# Patient Record
Sex: Male | Born: 1965 | Race: Black or African American | Hispanic: No | Marital: Single | Smoking: Current some day smoker
Health system: Southern US, Community
[De-identification: ages and names within clinical notes are randomized; demographics above are authoritative.]

## PROBLEM LIST (undated history)

## (undated) DIAGNOSIS — N1831 Chronic kidney disease, stage 3a: Secondary | ICD-10-CM

## (undated) DIAGNOSIS — I10 Essential (primary) hypertension: Secondary | ICD-10-CM

## (undated) DIAGNOSIS — E1129 Type 2 diabetes mellitus with other diabetic kidney complication: Secondary | ICD-10-CM

## (undated) DIAGNOSIS — H5440 Blindness, one eye, unspecified eye: Secondary | ICD-10-CM

---

## 2022-04-24 ENCOUNTER — Encounter: Payer: Self-pay | Admitting: Internal Medicine

## 2022-04-24 ENCOUNTER — Observation Stay: Payer: Self-pay

## 2022-04-24 ENCOUNTER — Emergency Department: Payer: Self-pay

## 2022-04-24 ENCOUNTER — Observation Stay
Admission: EM | Admit: 2022-04-24 | Discharge: 2022-04-25 | Disposition: A | Discharge status: Home / Self Care | Payer: Self-pay | Attending: Obstetrics and Gynecology | Admitting: Obstetrics and Gynecology

## 2022-04-24 ENCOUNTER — Other Ambulatory Visit: Payer: Self-pay

## 2022-04-24 ENCOUNTER — Observation Stay
Admit: 2022-04-24 | Discharge: 2022-04-24 | Disposition: A | Discharge status: Home / Self Care | Payer: Self-pay | Attending: Internal Medicine | Admitting: Internal Medicine

## 2022-04-24 DIAGNOSIS — R079 Chest pain, unspecified: Secondary | ICD-10-CM

## 2022-04-24 DIAGNOSIS — F17210 Nicotine dependence, cigarettes, uncomplicated: Secondary | ICD-10-CM | POA: Insufficient documentation

## 2022-04-24 DIAGNOSIS — N1831 Chronic kidney disease, stage 3a: Secondary | ICD-10-CM

## 2022-04-24 DIAGNOSIS — E1129 Type 2 diabetes mellitus with other diabetic kidney complication: Secondary | ICD-10-CM | POA: Diagnosis present

## 2022-04-24 DIAGNOSIS — E1122 Type 2 diabetes mellitus with diabetic chronic kidney disease: Secondary | ICD-10-CM | POA: Insufficient documentation

## 2022-04-24 DIAGNOSIS — R778 Other specified abnormalities of plasma proteins: Secondary | ICD-10-CM | POA: Insufficient documentation

## 2022-04-24 DIAGNOSIS — I2693 Single subsegmental pulmonary embolism without acute cor pulmonale: Principal | ICD-10-CM | POA: Insufficient documentation

## 2022-04-24 DIAGNOSIS — I2699 Other pulmonary embolism without acute cor pulmonale: Secondary | ICD-10-CM

## 2022-04-24 DIAGNOSIS — I10 Essential (primary) hypertension: Secondary | ICD-10-CM | POA: Diagnosis present

## 2022-04-24 DIAGNOSIS — I129 Hypertensive chronic kidney disease with stage 1 through stage 4 chronic kidney disease, or unspecified chronic kidney disease: Secondary | ICD-10-CM | POA: Insufficient documentation

## 2022-04-24 DIAGNOSIS — I160 Hypertensive urgency: Secondary | ICD-10-CM

## 2022-04-24 DIAGNOSIS — R7989 Other specified abnormal findings of blood chemistry: Secondary | ICD-10-CM | POA: Diagnosis present

## 2022-04-24 HISTORY — DX: Chronic kidney disease, stage 3a: N18.31

## 2022-04-24 HISTORY — DX: Type 2 diabetes mellitus with other diabetic kidney complication: E11.29

## 2022-04-24 HISTORY — DX: Essential (primary) hypertension: I10

## 2022-04-24 LAB — CBC WITH DIFFERENTIAL/PLATELET
Abs Immature Granulocytes: 0.01 10*3/uL (ref 0.00–0.07)
Basophils Absolute: 0.1 10*3/uL (ref 0.0–0.1)
Basophils Relative: 1 %
Eosinophils Absolute: 0.2 10*3/uL (ref 0.0–0.5)
Eosinophils Relative: 3 %
HCT: 35.1 % — ABNORMAL LOW (ref 39.0–52.0)
Hemoglobin: 11.4 g/dL — ABNORMAL LOW (ref 13.0–17.0)
Immature Granulocytes: 0 %
Lymphocytes Relative: 33 %
Lymphs Abs: 1.8 10*3/uL (ref 0.7–4.0)
MCH: 28.3 pg (ref 26.0–34.0)
MCHC: 32.5 g/dL (ref 30.0–36.0)
MCV: 87.1 fL (ref 80.0–100.0)
Monocytes Absolute: 0.4 10*3/uL (ref 0.1–1.0)
Monocytes Relative: 8 %
Neutro Abs: 2.9 10*3/uL (ref 1.7–7.7)
Neutrophils Relative %: 55 %
Platelets: 246 10*3/uL (ref 150–400)
RBC: 4.03 MIL/uL — ABNORMAL LOW (ref 4.22–5.81)
RDW: 14.1 % (ref 11.5–15.5)
WBC: 5.3 10*3/uL (ref 4.0–10.5)
nRBC: 0 % (ref 0.0–0.2)

## 2022-04-24 LAB — PROTIME-INR
INR: 1.1 (ref 0.8–1.2)
Prothrombin Time: 14 seconds (ref 11.4–15.2)

## 2022-04-24 LAB — COMPREHENSIVE METABOLIC PANEL
ALT: 12 U/L (ref 0–44)
AST: 17 U/L (ref 15–41)
Albumin: 2.8 g/dL — ABNORMAL LOW (ref 3.5–5.0)
Alkaline Phosphatase: 78 U/L (ref 38–126)
Anion gap: 5 (ref 5–15)
BUN: 21 mg/dL — ABNORMAL HIGH (ref 6–20)
CO2: 22 mmol/L (ref 22–32)
Calcium: 8.5 mg/dL — ABNORMAL LOW (ref 8.9–10.3)
Chloride: 108 mmol/L (ref 98–111)
Creatinine, Ser: 1.59 mg/dL — ABNORMAL HIGH (ref 0.61–1.24)
GFR, Estimated: 52 mL/min — ABNORMAL LOW (ref >60)
Glucose, Bld: 178 mg/dL — ABNORMAL HIGH (ref 70–99)
Potassium: 4.3 mmol/L (ref 3.5–5.1)
Sodium: 135 mmol/L (ref 135–145)
Total Bilirubin: 0.7 mg/dL (ref 0.3–1.2)
Total Protein: 7.2 g/dL (ref 6.5–8.1)

## 2022-04-24 LAB — TROPONIN I (HIGH SENSITIVITY)
Troponin I (High Sensitivity): 19 ng/L — ABNORMAL HIGH (ref <18)
Troponin I (High Sensitivity): 22 ng/L — ABNORMAL HIGH (ref <18)

## 2022-04-24 LAB — LIPASE, BLOOD: Lipase: 26 U/L (ref 11–51)

## 2022-04-24 LAB — APTT: aPTT: 60 seconds — ABNORMAL HIGH (ref 24–36)

## 2022-04-24 LAB — GLUCOSE, CAPILLARY
Glucose-Capillary: 199 mg/dL — ABNORMAL HIGH (ref 70–99)
Glucose-Capillary: 262 mg/dL — ABNORMAL HIGH (ref 70–99)

## 2022-04-24 LAB — BRAIN NATRIURETIC PEPTIDE: B Natriuretic Peptide: 176.5 pg/mL — ABNORMAL HIGH (ref 0.0–100.0)

## 2022-04-24 LAB — CBG MONITORING, ED: Glucose-Capillary: 189 mg/dL — ABNORMAL HIGH (ref 70–99)

## 2022-04-24 LAB — ANTITHROMBIN III: AntiThromb III Func: 108 % (ref 75–120)

## 2022-04-24 LAB — D-DIMER, QUANTITATIVE: D-Dimer, Quant: 1.52 ug/mL-FEU — ABNORMAL HIGH (ref 0.00–0.50)

## 2022-04-24 LAB — HEPARIN LEVEL (UNFRACTIONATED): Heparin Unfractionated: 0.39 IU/mL (ref 0.30–0.70)

## 2022-04-24 MED ORDER — OXYCODONE-ACETAMINOPHEN 5-325 MG PO TABS: 1.0000 | ORAL_TABLET | ORAL | Status: DC | PRN

## 2022-04-24 MED ORDER — MORPHINE SULFATE (PF) 4 MG/ML IV SOLN
4.0000 mg | INTRAVENOUS | Status: DC | PRN
  Administered 2022-04-24: 4 mg via INTRAVENOUS
  Filled 2022-04-24: qty 1

## 2022-04-24 MED ORDER — AMLODIPINE BESYLATE 10 MG PO TABS
10.0000 mg | ORAL_TABLET | Freq: Every day | ORAL | Status: DC
  Administered 2022-04-25: 10 mg via ORAL
  Filled 2022-04-24: qty 1

## 2022-04-24 MED ORDER — ONDANSETRON HCL 4 MG/2ML IJ SOLN: 4.0000 mg | Freq: Three times a day (TID) | INTRAMUSCULAR | Status: DC | PRN

## 2022-04-24 MED ORDER — HYDROMORPHONE HCL 1 MG/ML IJ SOLN: 1.0000 mg | INTRAMUSCULAR | Status: DC | PRN

## 2022-04-24 MED ORDER — INSULIN ASPART 100 UNIT/ML IJ SOLN
0.0000 [IU] | Freq: Every day | INTRAMUSCULAR | Status: DC
  Administered 2022-04-24: 3 [IU] via SUBCUTANEOUS
  Filled 2022-04-24: qty 1

## 2022-04-24 MED ORDER — SODIUM CHLORIDE 0.9 % IV BOLUS
1000.0000 mL | Freq: Once | INTRAVENOUS | Status: AC
  Administered 2022-04-24: 1000 mL via INTRAVENOUS

## 2022-04-24 MED ORDER — IOHEXOL 350 MG/ML SOLN
75.0000 mL | Freq: Once | INTRAVENOUS | Status: AC | PRN
  Administered 2022-04-24: 75 mL via INTRAVENOUS

## 2022-04-24 MED ORDER — HYDRALAZINE HCL 20 MG/ML IJ SOLN
5.0000 mg | INTRAMUSCULAR | Status: DC | PRN
  Administered 2022-04-24: 5 mg via INTRAVENOUS
  Filled 2022-04-24: qty 1

## 2022-04-24 MED ORDER — AMLODIPINE BESYLATE 5 MG PO TABS
10.0000 mg | ORAL_TABLET | Freq: Once | ORAL | Status: AC
  Administered 2022-04-24: 10 mg via ORAL
  Filled 2022-04-24: qty 2

## 2022-04-24 MED ORDER — NICOTINE 21 MG/24HR TD PT24: 21.0000 mg | MEDICATED_PATCH | Freq: Every day | TRANSDERMAL | Status: DC

## 2022-04-24 MED ORDER — HEPARIN (PORCINE) 25000 UT/250ML-% IV SOLN
1450.0000 [IU]/h | INTRAVENOUS | Status: DC
Start: 2022-04-24 — End: 2022-04-25
  Administered 2022-04-24: 1300 [IU]/h via INTRAVENOUS
  Administered 2022-04-25: 1450 [IU]/h via INTRAVENOUS
  Filled 2022-04-24 (×2): qty 250

## 2022-04-24 MED ORDER — DM-GUAIFENESIN ER 30-600 MG PO TB12: 1.0000 | ORAL_TABLET | Freq: Two times a day (BID) | ORAL | Status: DC | PRN

## 2022-04-24 MED ORDER — ACETAMINOPHEN 325 MG PO TABS: 650.0000 mg | ORAL_TABLET | Freq: Four times a day (QID) | ORAL | Status: DC | PRN

## 2022-04-24 MED ORDER — HEPARIN BOLUS VIA INFUSION
5000.0000 [IU] | Freq: Once | INTRAVENOUS | Status: AC
  Administered 2022-04-24: 5000 [IU] via INTRAVENOUS
  Filled 2022-04-24: qty 5000

## 2022-04-24 MED ORDER — ASPIRIN 81 MG PO CHEW
81.0000 mg | CHEWABLE_TABLET | Freq: Every day | ORAL | Status: DC
  Administered 2022-04-24 – 2022-04-25 (×2): 81 mg via ORAL
  Filled 2022-04-24 (×2): qty 1

## 2022-04-24 MED ORDER — ALBUTEROL SULFATE (2.5 MG/3ML) 0.083% IN NEBU: 3.0000 mL | INHALATION_SOLUTION | RESPIRATORY_TRACT | Status: DC | PRN

## 2022-04-24 MED ORDER — ONDANSETRON HCL 4 MG/2ML IJ SOLN
4.0000 mg | Freq: Once | INTRAMUSCULAR | Status: AC
Start: 2022-04-24 — End: 2022-04-24
  Administered 2022-04-24: 4 mg via INTRAVENOUS
  Filled 2022-04-24: qty 2

## 2022-04-24 MED ORDER — INSULIN ASPART 100 UNIT/ML IJ SOLN
0.0000 [IU] | Freq: Three times a day (TID) | INTRAMUSCULAR | Status: DC
  Administered 2022-04-24 (×2): 2 [IU] via SUBCUTANEOUS
  Administered 2022-04-25: 7 [IU] via SUBCUTANEOUS
  Filled 2022-04-24 (×3): qty 1

## 2022-04-24 NOTE — Assessment & Plan Note (Signed)
Recent A1c 9.6, poorly controlled.  Patient taking glipizide and metformin. -Sliding scale insulin

## 2022-04-24 NOTE — ED Provider Notes (Addendum)
[facility] Provider Note    Event Date/Time   First MD Initiated Contact with Patient 04/24/22 [phone]     (approximate)   History   Chest Pain   HPI  Wyatt Taylor is a 56 y.o. male with no known history of cardiac or lung disease presents to the ER for evaluation of several days of left-sided chest pain and pressure not particularly worsened with movement or improved with rest.  No diaphoresis.  Pain is stabbing in nature.  Has never had pain like this before.  States he been off his blood pressure medications for few days but does have access to them.  Denies any abdominal pain no nausea or vomiting.     Physical Exam   Triage Vital Signs: ED Triage Vitals  Enc Vitals Group     BP 04/24/22 0900 (!) 166/98     Pulse Rate 04/24/22 0900 82     Resp 04/24/22 0900 14     Temp 04/24/22 0900 97.9 F (36.6 C)     Temp Source 04/24/22 0900 Oral     SpO2 04/24/22 0858 100 %     Weight 04/24/22 0900 170 lb (77.1 kg)     Height 04/24/22 0900 5\' 9"  (1.753 m)     Head Circumference --      Peak Flow --      Pain Score 04/24/22 0900 2     Pain Loc --      Pain Edu? --      Excl. in [place]? --     Most recent vital signs: Vitals:   04/24/22 0900 04/24/22 0933  BP: (!) 166/98 (!) 172/92  Pulse: 82 75  Resp: 14 16  Temp: 97.9 F (36.6 C)   SpO2: 98% 99%     Constitutional: Alert  Eyes: Conjunctivae are normal.  Head: Atraumatic. Nose: No congestion/rhinnorhea. Mouth/Throat: Mucous membranes are moist.   Neck: Painless ROM.  Cardiovascular:   Good peripheral circulation. No m/g/r Respiratory: Normal respiratory effort.  No retractions.  Gastrointestinal: Soft and nontender.  Musculoskeletal:  no deformity Neurologic:  MAE spontaneously. No gross focal neurologic deficits are appreciated.  Skin:  Skin is warm, dry and intact. No rash noted. Psychiatric: Mood and affect are normal. Speech and behavior are normal.    ED Results / Procedures /  Treatments   Labs (all labs ordered are listed, but only abnormal results are displayed) Labs Reviewed  CBC WITH DIFFERENTIAL/PLATELET - Abnormal; Notable for the following components:      Result Value   RBC 4.03 (*)    Hemoglobin 11.4 (*)    HCT 35.1 (*)    All other components within normal limits  COMPREHENSIVE METABOLIC PANEL - Abnormal; Notable for the following components:   Glucose, Bld 178 (*)    BUN 21 (*)    Creatinine, Ser 1.59 (*)    Calcium 8.5 (*)    Albumin 2.8 (*)    GFR, Estimated 52 (*)    All other components within normal limits  D-DIMER, QUANTITATIVE - Abnormal; Notable for the following components:   D-Dimer, Quant 1.52 (*)    All other components within normal limits  TROPONIN I (HIGH SENSITIVITY) - Abnormal; Notable for the following components:   Troponin I (High Sensitivity) 19 (*)    All other components within normal limits  LIPASE, BLOOD  PROTIME-INR  APTT  HEPARIN LEVEL (UNFRACTIONATED)  BRAIN NATRIURETIC PEPTIDE     EKG  ED ECG REPORT I,  Merlyn Lot, the attending physician, personally viewed and interpreted this ECG.   Date: 04/24/2022  EKG Time: 8:59  Rate: 80  Rhythm: sinus  Axis: normal  Intervals: normal  ST&T Change: no stemi, nonspecific st abn    RADIOLOGY Please see ED Course for my review and interpretation.  I personally reviewed all radiographic images ordered to evaluate for the above acute complaints and reviewed radiology reports and findings.  These findings were personally discussed with the patient.  Please see medical record for radiology report.    PROCEDURES:  Critical Care performed: Yes, see critical care procedure note(s)  .Critical Care Performed by: Merlyn Lot, MD Authorized by: Merlyn Lot, MD   Critical care provider statement:    Critical care time (minutes):  35   Critical care was necessary to treat or prevent imminent or life-threatening deterioration of the following  conditions:  Circulatory failure   Critical care was time spent personally by me on the following activities:  Ordering and performing treatments and interventions, ordering and review of laboratory studies, ordering and review of radiographic studies, pulse oximetry, re-evaluation of patient's condition, review of old charts, obtaining history from patient or surrogate, examination of patient, evaluation of patient's response to treatment, discussions with primary provider, discussions with consultants and development of treatment plan with patient or surrogate   MEDICATIONS ORDERED IN ED: Medications  morphine (PF) 4 MG/ML injection 4 mg (4 mg Intravenous Given 04/24/22 0933)  heparin bolus via infusion 5,000 Units (has no administration in time range)    Followed by  heparin ADULT infusion 100 units/mL (25000 units/262mL) (has no administration in time range)  hydrALAZINE (APRESOLINE) injection 5 mg (has no administration in time range)  ondansetron (ZOFRAN) injection 4 mg (4 mg Intravenous Given 04/24/22 0933)  amLODipine (NORVASC) tablet 10 mg (10 mg Oral Given 04/24/22 0933)  sodium chloride 0.9 % bolus 1,000 mL (0 mLs Intravenous Stopped 04/24/22 1035)  iohexol (OMNIPAQUE) 350 MG/ML injection 75 mL (75 mLs Intravenous Contrast Given 04/24/22 1003)     IMPRESSION / MDM / [place] / ED COURSE  I reviewed the triage vital signs and the nursing notes.                              Differential diagnosis includes, but is not limited to, ACS, pericarditis, esophagitis, boerhaaves, pe, dissection, pna, bronchitis, costochondritis  Patient presented to the ER for evaluation of chest discomfort as described above.  He is hypertensive afebrile otherwise hemodynamically stable and well perfused.  This presenting complaint could reflect a potentially life-threatening illness therefore the patient will be placed on continuous pulse oximetry and telemetry for monitoring.  Laboratory evaluation will  be sent to evaluate for the above complaints.    Clinical Course as of 04/24/22 1037  Sun Apr 24, 2022  0928 Patient's D-dimer is elevated.  Her renal insufficiency will give IV fluids and order CTA to further evaluate for PE. [PR]  1014 Chest x-ray by my interpretation does not show any evidence of infiltrate or mass.  CTA on my interpretation is concerning for possible subsegmental PE in the left lung.  Will await formal radiology report. [PR]  1030 Patient with positive PE.  Given his chest pain discomfort AKI will order heparin.  Will consult hospitalist for admission. [PR]    Clinical Course User Index [PR] Merlyn Lot, MD    Patient's presentation is most consistent with acute presentation with  potential threat to life or bodily function.   FINAL CLINICAL IMPRESSION(S) / ED DIAGNOSES   Final diagnoses:  Chest pain, unspecified type  Single subsegmental pulmonary embolism without acute cor pulmonale ([place])     Rx / DC Orders   ED Discharge Orders     None        Note:  This document was prepared using Dragon voice recognition software and may include unintentional dictation errors.     Merlyn Lot, MD 04/24/22 1056

## 2022-04-24 NOTE — Consult Note (Signed)
ANTICOAGULATION CONSULT NOTE  Pharmacy Consult for IV Heparin Indication: pulmonary embolus  Patient Measurements: Height: 5\' 9"  (175.3 cm) Weight: 77.1 kg (170 lb) IBW/kg (Calculated) : 70.7 Heparin Dosing Weight: 77.1 kg  Labs: Recent Labs    04/24/22 0903  HGB 11.4*  HCT 35.1*  PLT 246  CREATININE 1.59*  TROPONINIHS 19*    Estimated Creatinine Clearance: 53.7 mL/min (A) (by C-G formula based on SCr of 1.59 mg/dL (H)).   Medical History: No past medical history on file.  Medications:  No anticoagulation prior to admission per my chart review. Medication reconciliation is pending  Assessment: Patient is a 56 y/o M with medical history including DM who presented to the ED 6/4 with chest pain and subsequently found to have acute PE. Pharmacy consulted to initiate and manage IV heparin for PE treatment.   Baseline CBC notable for mild anemia. Baseline aPTT and PT-INR are pending.  Goal of Therapy:  Heparin level 0.3-0.7 units/ml Monitor platelets by anticoagulation protocol: Yes   Plan:  --Heparin 5000 unit IV bolus followed by continuous infusion at 1300 units/hr --HL 6 hours after initiation of infusion --Daily CBC per protocol while on IV heparin  8/4 04/24/2022,10:49 AM

## 2022-04-24 NOTE — Assessment & Plan Note (Addendum)
CTA showed acute pulmonary embolism involving segmental and subsegmental branch pulmonary arteries of the left upper lobe and left lower lobe. No CT evidence of right heart strain injury  -Will place on telemetry bed for obs -heparin drip initiated -2D echocardiogram ordered -LE dopplers ordered to evaluate for DVT --> negative for DVT -Hypercoag panel -pain control: When necessary Percocet and Dilaudid -prn albuterol nebs and mucinex  -check BNP

## 2022-04-24 NOTE — H&P (Signed)
History and Physical    Wyatt Taylor [id number] DOB: [birth year] DOA: 04/24/2022  Referring MD/NP/PA:   PCP: Pcp, No   Patient coming from:  The patient is coming from home.  At baseline, pt is independent for most of ADL.        Chief Complaint: Chest pain  HPI: Wyatt Taylor is a 56 y.o. male with medical history significant of hypertension, diabetes mellitus, CKD-3A, smoking cigar, who presents with chest pain.  Patient states that he has been having intermittent chest pain for more than 2 months, which has worsened in the past 2 days.  The chest pain is located in the left side of chest, sharp, moderate, pleuritic, aggravated by deep breath.  Patient has mild dry cough, denies shortness breath.  No fever or chills.  No recent long distance traveling.  No tenderness in the calf areas. Patient does not have nausea vomiting, diarrhea or abdominal pain.  No symptoms of UTI.  No recent dark stool or rectal bleeding.  No recent fall or head injury.   Data Reviewed and ED Course: pt was found to have WBC 5.3, troponin level 19, renal function at baseline, temperature normal, elevated blood pressure 236/137, heart rate 82, RR 18, oxygen saturation 98% on room air.  Chest x-ray negative.  CTA showed segmental and subsegmental PE.  Patient is placed on telemetry bed for obs.  CT angiogram: 1. Acute pulmonary embolism involving segmental and subsegmental branch pulmonary arteries of the left upper lobe and left lower lobe. Additional segmental branch PE within the left lower lobe. No saddle embolism. RV to LV ratio of 0.8. 2. Mild bibasilar atelectasis, slightly more pronounced on the left. Lungs are otherwise clear. 3. Left ventricular hypertrophy. Consider further evaluation with echocardiography. 4. Aortic and coronary artery atherosclerosis (ICD10-I70.0).   These results were called by telephone at the time of interpretation on 04/24/2022 at 10:28 am to provider Merlyn Lot , who  verbally acknowledged these results.  EKG: I have personally reviewed.  Sinus rhythm, QTc 449, T wave inversion in lead I/aVL.   Review of Systems:   General: no fevers, chills, no body weight gain, has fatigue HEENT: no blurry vision, hearing changes or sore throat Respiratory: no dyspnea, has coughing, no wheezing CV: has chest pain, no palpitations GI: no nausea, vomiting, abdominal pain, diarrhea, constipation GU: no dysuria, burning on urination, increased urinary frequency, hematuria  Ext: no leg edema Neuro: no unilateral weakness, numbness, or tingling, no vision change or hearing loss Skin: no rash, no skin tear. MSK: No muscle spasm, no deformity, no limitation of range of movement in spin Heme: No easy bruising.  Travel history: No recent long distant travel.   Allergy: No Known Allergies  Past Medical History:  Diagnosis Date   Chronic renal failure, stage 3a (HCC)    HTN (hypertension)    Type II diabetes mellitus with renal manifestations ([place])     History reviewed. No pertinent surgical history.  Social History:  reports that he has been smoking cigarettes. He has never used smokeless tobacco. He reports that he does not currently use alcohol. He reports that he does not use drugs.  Family History:  Family History  Problem Relation Age of Onset   Deep vein thrombosis Daughter    Pulmonary embolism Daughter      Prior to Admission medications   Not on File    Physical Exam: Vitals:   04/24/22 0900 04/24/22 0933 04/24/22 1142 04/24/22 1300  BP: (!) 166/98 Marland Kitchen)  172/92 (!) 195/88 138/79  Pulse: 82 75  92  Resp: 14 16  20   Temp: 97.9 F (36.6 C)   98.9 F (37.2 C)  TempSrc: Oral   Oral  SpO2: 98% 99%  97%  Weight: 77.1 kg     Height: 5\' 9"  (1.753 m)      General: Not in acute distress HEENT:       Eyes: PERRL, EOMI, no scleral icterus.       ENT: No discharge from the ears and nose, no pharynx injection, no tonsillar enlargement.        Neck:  No JVD, no bruit, no mass felt. Heme: No neck lymph node enlargement. Cardiac: S1/S2, RRR, No murmurs, No gallops or rubs. Respiratory: No rales, wheezing, rhonchi or rubs. GI: Soft, nondistended, nontender, no rebound pain, no organomegaly, BS present. GU: No hematuria Ext: No pitting leg edema bilaterally. 1+DP/PT pulse bilaterally. Musculoskeletal: No joint deformities, No joint redness or warmth, no limitation of ROM in spin. Skin: No rashes.  Neuro: Alert, oriented X3, cranial nerves II-XII grossly intact, moves all extremities normally.  Psych: Patient is not psychotic, no suicidal or hemocidal ideation.  Labs on Admission: I have personally reviewed following labs and imaging studies  CBC: Recent Labs  Lab 04/24/22 0903  WBC 5.3  NEUTROABS 2.9  HGB 11.4*  HCT 35.1*  MCV 87.1  PLT 0000000   Basic Metabolic Panel: Recent Labs  Lab 04/24/22 0903  NA 135  K 4.3  CL 108  CO2 22  GLUCOSE 178*  BUN 21*  CREATININE 1.59*  CALCIUM 8.5*   GFR: Estimated Creatinine Clearance: 53.7 mL/min (A) (by C-G formula based on SCr of 1.59 mg/dL (H)). Liver Function Tests: Recent Labs  Lab 04/24/22 0903  AST 17  ALT 12  ALKPHOS 78  BILITOT 0.7  PROT 7.2  ALBUMIN 2.8*   Recent Labs  Lab 04/24/22 0903  LIPASE 26   No results for input(s): AMMONIA in the last 168 hours. Coagulation Profile: No results for input(s): INR, PROTIME in the last 168 hours. Cardiac Enzymes: No results for input(s): CKTOTAL, CKMB, CKMBINDEX, TROPONINI in the last 168 hours. BNP (last 3 results) No results for input(s): PROBNP in the last 8760 hours. HbA1C: No results for input(s): HGBA1C in the last 72 hours. CBG: Recent Labs  Lab 04/24/22 1254  GLUCAP 189*   Lipid Profile: No results for input(s): CHOL, HDL, LDLCALC, TRIG, CHOLHDL, LDLDIRECT in the last 72 hours. Thyroid Function Tests: No results for input(s): TSH, T4TOTAL, FREET4, T3FREE, THYROIDAB in the last 72 hours. Anemia  Panel: No results for input(s): VITAMINB12, FOLATE, FERRITIN, TIBC, IRON, RETICCTPCT in the last 72 hours. Urine analysis: No results found for: COLORURINE, APPEARANCEUR, LABSPEC, PHURINE, GLUCOSEU, HGBUR, BILIRUBINUR, KETONESUR, PROTEINUR, UROBILINOGEN, NITRITE, LEUKOCYTESUR Sepsis Labs: @LABRCNTIP (procalcitonin:4,lacticidven:4) )No results found for this or any previous visit (from the past 240 hour(s)).   Radiological Exams on Admission: DG Chest 2 View  Result Date: 04/24/2022 CLINICAL DATA:  Sided chest pain.  Evaluate for infiltrate. EXAM: CHEST - 2 VIEW COMPARISON:  None Available. FINDINGS: The heart size and mediastinal contours are within normal limits. Both lungs are clear. The visualized skeletal structures are unremarkable. IMPRESSION: No active cardiopulmonary disease. Electronically Signed   By: Dorise Bullion III M.D.   On: 04/24/2022 09:27   CT Angio Chest PE W and/or Wo Contrast  Result Date: 04/24/2022 CLINICAL DATA:  Pulmonary embolism (PE) suspected, positive D-dimer EXAM: CT ANGIOGRAPHY CHEST WITH CONTRAST TECHNIQUE: Multidetector  CT imaging of the chest was performed using the standard protocol during bolus administration of intravenous contrast. Multiplanar CT image reconstructions and MIPs were obtained to evaluate the vascular anatomy. RADIATION DOSE REDUCTION: This exam was performed according to the departmental dose-optimization program which includes automated exposure control, adjustment of the mA and/or kV according to patient size and/or use of iterative reconstruction technique. CONTRAST:  46mL OMNIPAQUE IOHEXOL 350 MG/ML SOLN COMPARISON:  Same day chest x-ray FINDINGS: Cardiovascular: Adequate opacification of the pulmonary arteries. Filling defect seen within a anterior segmental branch pulmonary artery of the left upper lobe (series 6, image 156) extending into distal subsegmental branch arteries. Additional segmental branch left lower lobe PE (series 6, image 208).  No right-sided pulmonary arterial filling defect. No saddle embolism. RV to LV ratio of 0.8. Nonenlarged heart with prominent left ventricular hypertrophy. Thoracic aorta is nonaneurysmal. Scattered atherosclerotic calcifications of the aorta and coronary arteries. Mediastinum/Nodes: No enlarged mediastinal, hilar, or axillary lymph nodes. Thyroid gland, trachea, and esophagus demonstrate no significant findings. Lungs/Pleura: Mild bibasilar atelectasis, slightly more pronounced on the left. Lungs are otherwise clear. No pleural effusion or pneumothorax. Upper Abdomen: No acute abnormality. Musculoskeletal: No chest wall abnormality. No acute or significant osseous findings. Multiple old healed right-sided rib fractures. Review of the MIP images confirms the above findings. IMPRESSION: 1. Acute pulmonary embolism involving segmental and subsegmental branch pulmonary arteries of the left upper lobe and left lower lobe. Additional segmental branch PE within the left lower lobe. No saddle embolism. RV to LV ratio of 0.8. 2. Mild bibasilar atelectasis, slightly more pronounced on the left. Lungs are otherwise clear. 3. Left ventricular hypertrophy. Consider further evaluation with echocardiography. 4. Aortic and coronary artery atherosclerosis (ICD10-I70.0). These results were called by telephone at the time of interpretation on 04/24/2022 at 10:28 am to provider Merlyn Lot , who verbally acknowledged these results. Electronically Signed   By: Davina Poke D.O.   On: 04/24/2022 10:30   US Venous Img Lower Bilateral (DVT)  Result Date: 04/24/2022 CLINICAL DATA:  56 year old male with acute pulmonary emboli. Evaluate LOWER extremities for DVT. EXAM: BILATERAL LOWER EXTREMITY VENOUS DOPPLER ULTRASOUND TECHNIQUE: Gray-scale sonography with compression, as well as color and duplex ultrasound, were performed to evaluate the deep venous system(s) from the level of the common femoral vein through the popliteal and  proximal calf veins. COMPARISON:  None Available. FINDINGS: VENOUS Normal compressibility of the common femoral, superficial femoral, and popliteal veins, as well as the visualized calf veins. Visualized portions of profunda femoral vein and great saphenous vein unremarkable. No filling defects to suggest DVT on grayscale or color Doppler imaging. Doppler waveforms show normal direction of venous flow, normal respiratory plasticity and response to augmentation. OTHER None. Limitations: none IMPRESSION: Negative.  No evidence of DVT within bilateral LOWER extremities. Electronically Signed   By: Margarette Canada M.D.   On: 04/24/2022 12:22      Assessment/Plan Principal Problem:   Acute pulmonary embolism (HCC) Active Problems:   Hypertensive urgency   Chronic renal failure, stage 3a (HCC)   Type II diabetes mellitus with renal manifestations (HCC)   Elevated troponin     Assessment and Plan: * Acute pulmonary embolism (HCC) CTA showed acute pulmonary embolism involving segmental and subsegmental branch pulmonary arteries of the left upper lobe and left lower lobe. No CT evidence of right heart strain injury  -Will place on telemetry bed for obs -heparin drip initiated -2D echocardiogram ordered -LE dopplers ordered to evaluate for DVT -->  negative for DVT -Hypercoag panel -pain control: When necessary Percocet and Dilaudid -prn albuterol nebs and mucinex  -check BNP  Hypertensive urgency Bp 236/137.  This is most likely due to medication noncompliance.  Patient is not taking his amlodipine and Cozaar at home. -Start amlodipine 10 mg daily -IV hydralazine as needed  Chronic renal failure, stage 3a (HCC) Recent creatinine 2.61 on 01/01/2022.  His creatinine is 1.59, BUN 21.  -Follow-up with BMP  Type II diabetes mellitus with renal manifestations (HCC) Recent A1c 9.6, poorly controlled.  Patient taking glipizide and metformin. -Sliding scale insulin  Elevated troponin Troponin  level 19.  Most likely due to PE and a demand ischemia secondary to hypertensive urgency. -get another trop -ASA             DVT ppx:  on IV Heparin    Code Status: Full code  Family Communication:    Yes, patient's daughter   at bed side.      Disposition Plan:  Anticipate discharge back to previous environment  Consults called:  none  Admission status and  Level of care: Telemetry Cardiac:  for obs    Severity of Illness:  The appropriate patient status for this patient is OBSERVATION. Observation status is judged to be reasonable and necessary in order to provide the required intensity of service to ensure the patient's safety. The patient's presenting symptoms, physical exam findings, and initial radiographic and laboratory data in the context of their medical condition is felt to place them at decreased risk for further clinical deterioration. Furthermore, it is anticipated that the patient will be medically stable for discharge from the hospital within 2 midnights of admission.        Date of Service 04/24/2022    [facility]   If 7PM-7AM, please contact night-coverage [email or web address] 04/24/2022, 1:59 PM

## 2022-04-24 NOTE — ED Triage Notes (Signed)
Pt bib EMS c/o intermittent left sided chest pain, non radiating x 2 days progressively getting worse. Pt received ASA 4, Nitro x 1, Nitro paste 1.5inch; pain now 2/10 per pt. Denies other symptoms. H/O HTN  BP 236/137-> 201/110 (did not take BP pills). HR 84 SPO2 100% RA

## 2022-04-24 NOTE — Assessment & Plan Note (Signed)
Recent creatinine 2.61 on 01/01/2022.  His creatinine is 1.59, BUN 21.  -Follow-up with BMP

## 2022-04-24 NOTE — Consult Note (Addendum)
[place] for IV Heparin Indication: pulmonary embolus  Patient Measurements: Height: 5\' 9"  (175.3 cm) Weight: 77.1 kg (170 lb) IBW/kg (Calculated) : 70.7 Heparin Dosing Weight: 77.1 kg  Labs: Recent Labs    04/24/22 0903 04/24/22 1611 04/24/22 1800  HGB 11.4*  --   --   HCT 35.1*  --   --   PLT 246  --   --   APTT  --  60*  --   LABPROT  --  14.0  --   INR  --  1.1  --   HEPARINUNFRC  --   --  0.39  CREATININE 1.59*  --   --   TROPONINIHS 19* 22*  --      Estimated Creatinine Clearance: 53.7 mL/min (A) (by C-G formula based on SCr of 1.59 mg/dL (H)).   Medical History: Past Medical History:  Diagnosis Date   Chronic renal failure, stage 3a (HCC)    HTN (hypertension)    Type II diabetes mellitus with renal manifestations (HCC)     Medications:  No anticoagulation prior to admission per my chart review. Medication reconciliation is pending  Assessment: Patient is a 56 y/o M with medical history including DM who presented to the ED 6/4 with chest pain and subsequently found to have acute PE. Pharmacy consulted to initiate and manage IV heparin for PE treatment.   Baseline CBC notable for mild anemia. Baseline aPTT and PT-INR are pending.  Date Time HL Rate/comment 6/4 1800 0.39 1300 un/hr, therapeutic   Goal of Therapy:  Heparin level 0.3-0.7 units/ml Monitor platelets by anticoagulation protocol: Yes   Plan:  Heparin level 0.39, therapeutic  Continue heparin infusion at 1300 units/hr Check confirmatory heparin level in 6 hours  Monitor daily CBC, s/s of bleed   Darnelle Bos, PharmD 04/24/2022,8:15 PM

## 2022-04-24 NOTE — Assessment & Plan Note (Signed)
Bp 236/137.  This is most likely due to medication noncompliance.  Patient is not taking his amlodipine and Cozaar at home. -Start amlodipine 10 mg daily -IV hydralazine as needed

## 2022-04-24 NOTE — Assessment & Plan Note (Signed)
Troponin level 19.  Most likely due to PE and a demand ischemia secondary to hypertensive urgency. -get another trop -ASA

## 2022-04-25 ENCOUNTER — Other Ambulatory Visit: Payer: Self-pay

## 2022-04-25 LAB — ECHOCARDIOGRAM COMPLETE
AR max vel: 2.33 cm2
AV Area VTI: 2.72 cm2
AV Area mean vel: 2.25 cm2
AV Mean grad: 7 mmHg
AV Peak grad: 10.6 mmHg
Ao pk vel: 1.63 m/s
Area-P 1/2: 4.77 cm2
Height: 69 in
MV VTI: 2.75 cm2
S' Lateral: 2.24 cm
Weight: 2720 oz

## 2022-04-25 LAB — HIV ANTIBODY (ROUTINE TESTING W REFLEX): HIV Screen 4th Generation wRfx: NONREACTIVE

## 2022-04-25 LAB — CBC
HCT: 27.6 % — ABNORMAL LOW (ref 39.0–52.0)
Hemoglobin: 9.4 g/dL — ABNORMAL LOW (ref 13.0–17.0)
MCH: 28.9 pg (ref 26.0–34.0)
MCHC: 34.1 g/dL (ref 30.0–36.0)
MCV: 84.9 fL (ref 80.0–100.0)
Platelets: 227 10*3/uL (ref 150–400)
RBC: 3.25 MIL/uL — ABNORMAL LOW (ref 4.22–5.81)
RDW: 13.9 % (ref 11.5–15.5)
WBC: 5.6 10*3/uL (ref 4.0–10.5)
nRBC: 0 % (ref 0.0–0.2)

## 2022-04-25 LAB — BASIC METABOLIC PANEL
Anion gap: 5 (ref 5–15)
BUN: 27 mg/dL — ABNORMAL HIGH (ref 6–20)
CO2: 23 mmol/L (ref 22–32)
Calcium: 7.8 mg/dL — ABNORMAL LOW (ref 8.9–10.3)
Chloride: 109 mmol/L (ref 98–111)
Creatinine, Ser: 1.64 mg/dL — ABNORMAL HIGH (ref 0.61–1.24)
GFR, Estimated: 50 mL/min — ABNORMAL LOW (ref >60)
Glucose, Bld: 253 mg/dL — ABNORMAL HIGH (ref 70–99)
Potassium: 4.3 mmol/L (ref 3.5–5.1)
Sodium: 137 mmol/L (ref 135–145)

## 2022-04-25 LAB — GLUCOSE, CAPILLARY
Glucose-Capillary: 303 mg/dL — ABNORMAL HIGH (ref 70–99)
Glucose-Capillary: 145 mg/dL — ABNORMAL HIGH (ref 70–99)

## 2022-04-25 LAB — HEPARIN LEVEL (UNFRACTIONATED)
Heparin Unfractionated: 0.26 IU/mL — ABNORMAL LOW (ref 0.30–0.70)
Heparin Unfractionated: 0.38 IU/mL (ref 0.30–0.70)

## 2022-04-25 MED ORDER — APIXABAN (ELIQUIS) VTE STARTER PACK (10MG AND 5MG)
ORAL_TABLET | ORAL | 0 refills | Status: DC
Start: 2022-04-25 — End: 2022-04-25

## 2022-04-25 MED ORDER — HEPARIN BOLUS VIA INFUSION
1200.0000 [IU] | Freq: Once | INTRAVENOUS | Status: AC
  Administered 2022-04-25: 1200 [IU] via INTRAVENOUS
  Filled 2022-04-25: qty 1200

## 2022-04-25 MED ORDER — APIXABAN 5 MG PO TABS
ORAL_TABLET | ORAL | Status: AC
  Filled 2022-04-25: qty 2

## 2022-04-25 MED ORDER — APIXABAN 5 MG PO TABS
10.0000 mg | ORAL_TABLET | Freq: Once | ORAL | Status: AC
Start: 2022-04-25 — End: 2022-04-25
  Administered 2022-04-25: 10 mg via ORAL

## 2022-04-25 MED ORDER — APIXABAN 5 MG PO TABS
5.0000 mg | ORAL_TABLET | Freq: Two times a day (BID) | ORAL | 0 refills | Status: DC
  Filled 2022-04-25: qty 60, 30d supply, fill #0

## 2022-04-25 MED ORDER — APIXABAN 5 MG PO TABS
5.0000 mg | ORAL_TABLET | Freq: Two times a day (BID) | ORAL | 0 refills | Status: AC
  Filled 2022-04-25: qty 60, 30d supply, fill #0

## 2022-04-25 MED ORDER — APIXABAN 5 MG PO TABS: 5.0000 mg | ORAL_TABLET | Freq: Two times a day (BID) | ORAL | 0 refills | Status: DC

## 2022-04-25 MED ORDER — APIXABAN (ELIQUIS) VTE STARTER PACK (10MG AND 5MG)
ORAL_TABLET | ORAL | 0 refills | Status: DC
  Filled 2022-04-25: qty 74, 28d supply, fill #0

## 2022-04-25 NOTE — Discharge Summary (Signed)
Wyatt Taylor [id number] DOB: [birth year] DOA: 04/24/2022  PCP: Pcp, No  Admit date: 04/24/2022 Discharge date: 04/25/2022  Time spent: 40 minutes  Recommendations for Outpatient Follow-up:  PCP f/u 1-2 weeks, will need to f/u results of hypercoagulability panel ordered and pending Will need to ensure uptodate with cancer screening Consider hematology f/u     Discharge Diagnoses:  Principal Problem:   Acute pulmonary embolism ([place]) Active Problems:   Hypertensive urgency   HTN (hypertension)   Chronic renal failure, stage 3a ([place])   Type II diabetes mellitus with renal manifestations ([place])   Elevated troponin   Discharge Condition: stable  Diet recommendation: heart healthy  Filed Weights   04/24/22 0900  Weight: 77.1 kg    History of present illness:  From admission h and p Wyatt Taylor is a 56 y.o. male with medical history significant of hypertension, diabetes mellitus, CKD-3A, smoking cigar, who presents with chest pain.   Patient states that he has been having intermittent chest pain for more than 2 months, which has worsened in the past 2 days.  The chest pain is located in the left side of chest, sharp, moderate, pleuritic, aggravated by deep breath.  Patient has mild dry cough, denies shortness breath.  No fever or chills.  No recent long distance traveling.  No tenderness in the calf areas. Patient does not have nausea vomiting, diarrhea or abdominal pain.  No symptoms of UTI.  No recent dark stool or rectal bleeding.  No recent fall or head injury.  Hospital Course:  Patient presented with 2 months pleuritic chest pain. CTA showed acute PE involving segmental and subsegmental branch pulmonary artieries left upper and lower lobes. No right heart strain on CTA and confirmed with TTE. PVLs negative for DVT. No hypxoxia or dyspnea, ambulating without difficulty. Hypercoag panel ordered and pending. Hgb 9s is stable from prior readings, no report of bleeding. Treated 24  hours with IV heparin, stable on that. Discharged home with apixaban, given card for first month free. Also initial hypertensive urgency resolved with resumption of home meds. A1c in the 9s, TTE does show severe LVH, I discussed with patient importance of treating his chronic medical conditions.  Procedures: none   Consultations: none  Discharge Exam: Vitals:   04/25/22 0054 04/25/22 0519  BP: (!) 141/85 (!) 157/88  Pulse: 76 80  Resp: 18 18  Temp: 98.1 F (36.7 C) 98.1 F (36.7 C)  SpO2: 100% 100%    General: NAD Cardiovascular: RRR Respiratory: CTAB Ext: no edema  Discharge Instructions   Discharge Instructions     Diet - low sodium heart healthy   Complete by: As directed    Increase activity slowly   Complete by: As directed       Allergies as of 04/25/2022   No Known Allergies      Medication List     STOP taking these medications    aspirin 81 MG chewable tablet       TAKE these medications    amLODipine 5 MG tablet Commonly known as: NORVASC Take 10 mg by mouth daily.   Apixaban Starter Pack (10mg  and 5mg ) Commonly known as: ELIQUIS STARTER PACK Take as directed on package: start with two-5mg  tablets twice daily for 7 days. On day 8, switch to one-5mg  tablet twice daily.   apixaban 5 MG Tabs tablet Commonly known as: ELIQUIS Take 1 tablet (5 mg total) by mouth 2 (two) times daily. Start AFTER completing starter pack   glipiZIDE 10  MG tablet Commonly known as: GLUCOTROL Take 10 mg by mouth 2 (two) times daily before a meal.   losartan 25 MG tablet Commonly known as: COZAAR Take 25 mg by mouth daily.   metFORMIN 1000 MG tablet Commonly known as: GLUCOPHAGE Take 1,000 mg by mouth 2 (two) times daily with a meal.       No Known Allergies  Follow-up Information     Your primary care provider Follow up.   Why: in 1-2 weeks                 The results of significant diagnostics from this hospitalization (including imaging,  microbiology, ancillary and laboratory) are listed below for reference.    Significant Diagnostic Studies: DG Chest 2 View  Result Date: 04/24/2022 CLINICAL DATA:  Sided chest pain.  Evaluate for infiltrate. EXAM: CHEST - 2 VIEW COMPARISON:  None Available. FINDINGS: The heart size and mediastinal contours are within normal limits. Both lungs are clear. The visualized skeletal structures are unremarkable. IMPRESSION: No active cardiopulmonary disease. Electronically Signed   By: Dorise Bullion III M.D.   On: 04/24/2022 09:27   CT Angio Chest PE W and/or Wo Contrast  Result Date: 04/24/2022 CLINICAL DATA:  Pulmonary embolism (PE) suspected, positive D-dimer EXAM: CT ANGIOGRAPHY CHEST WITH CONTRAST TECHNIQUE: Multidetector CT imaging of the chest was performed using the standard protocol during bolus administration of intravenous contrast. Multiplanar CT image reconstructions and MIPs were obtained to evaluate the vascular anatomy. RADIATION DOSE REDUCTION: This exam was performed according to the departmental dose-optimization program which includes automated exposure control, adjustment of the mA and/or kV according to patient size and/or use of iterative reconstruction technique. CONTRAST:  42mL OMNIPAQUE IOHEXOL 350 MG/ML SOLN COMPARISON:  Same day chest x-ray FINDINGS: Cardiovascular: Adequate opacification of the pulmonary arteries. Filling defect seen within a anterior segmental branch pulmonary artery of the left upper lobe (series 6, image 156) extending into distal subsegmental branch arteries. Additional segmental branch left lower lobe PE (series 6, image 208). No right-sided pulmonary arterial filling defect. No saddle embolism. RV to LV ratio of 0.8. Nonenlarged heart with prominent left ventricular hypertrophy. Thoracic aorta is nonaneurysmal. Scattered atherosclerotic calcifications of the aorta and coronary arteries. Mediastinum/Nodes: No enlarged mediastinal, hilar, or axillary lymph nodes.  Thyroid gland, trachea, and esophagus demonstrate no significant findings. Lungs/Pleura: Mild bibasilar atelectasis, slightly more pronounced on the left. Lungs are otherwise clear. No pleural effusion or pneumothorax. Upper Abdomen: No acute abnormality. Musculoskeletal: No chest wall abnormality. No acute or significant osseous findings. Multiple old healed right-sided rib fractures. Review of the MIP images confirms the above findings. IMPRESSION: 1. Acute pulmonary embolism involving segmental and subsegmental branch pulmonary arteries of the left upper lobe and left lower lobe. Additional segmental branch PE within the left lower lobe. No saddle embolism. RV to LV ratio of 0.8. 2. Mild bibasilar atelectasis, slightly more pronounced on the left. Lungs are otherwise clear. 3. Left ventricular hypertrophy. Consider further evaluation with echocardiography. 4. Aortic and coronary artery atherosclerosis (ICD10-I70.0). These results were called by telephone at the time of interpretation on 04/24/2022 at 10:28 am to provider Merlyn Lot , who verbally acknowledged these results. Electronically Signed   By: Davina Poke D.O.   On: 04/24/2022 10:30   US Venous Img Lower Bilateral (DVT)  Result Date: 04/24/2022 CLINICAL DATA:  56 year old male with acute pulmonary emboli. Evaluate LOWER extremities for DVT. EXAM: BILATERAL LOWER EXTREMITY VENOUS DOPPLER ULTRASOUND TECHNIQUE: Gray-scale sonography with compression, as well  as color and duplex ultrasound, were performed to evaluate the deep venous system(s) from the level of the common femoral vein through the popliteal and proximal calf veins. COMPARISON:  None Available. FINDINGS: VENOUS Normal compressibility of the common femoral, superficial femoral, and popliteal veins, as well as the visualized calf veins. Visualized portions of profunda femoral vein and great saphenous vein unremarkable. No filling defects to suggest DVT on grayscale or color Doppler  imaging. Doppler waveforms show normal direction of venous flow, normal respiratory plasticity and response to augmentation. OTHER None. Limitations: none IMPRESSION: Negative.  No evidence of DVT within bilateral LOWER extremities. Electronically Signed   By: Margarette Canada M.D.   On: 04/24/2022 12:22   ECHOCARDIOGRAM COMPLETE  Result Date: 04/25/2022    ECHOCARDIOGRAM REPORT   Patient Name:   Wyatt Taylor Date of Exam: 04/24/2022 Medical Rec #:  [id number]       Height:       69.0 in Accession #:    [id number]      Weight:       170.0 lb Date of Birth:  May 25, 1966        BSA:          1.928 m Patient Age:    56 years        BP:           195/88 mmHg Patient Gender: M               HR:           86 bpm. Exam Location:  [facility] Procedure: 2D Echo, Cardiac Doppler and Color Doppler Indications:     I26.09 Pulmonary embolism  History:         Patient has no prior history of Echocardiogram examinations.                  Risk Factors:Current Smoker, Hypertension and Diabetes.  Sonographer:     Rosalia Hammers Referring Phys:  [place] Diagnosing Phys: [place]  1. Left ventricular ejection fraction, by estimation, is 60 to 65%. The left ventricle has normal function. The left ventricle has no regional wall motion abnormalities. There is severe concentric left ventricular hypertrophy. Left ventricular diastolic  parameters are consistent with Grade I diastolic dysfunction (impaired relaxation).  2. Right ventricular systolic function is normal. The right ventricular size is normal.  3. The mitral valve is normal in structure. Trivial mitral valve regurgitation. No evidence of mitral stenosis.  4. The aortic valve is normal in structure. Aortic valve regurgitation is mild. Aortic valve sclerosis/calcification is present, without any evidence of aortic stenosis.  5. The inferior vena cava is normal in size with greater than 50% respiratory variability, suggesting right atrial pressure of 3 mmHg. FINDINGS   Left Ventricle: Left ventricular ejection fraction, by estimation, is 60 to 65%. The left ventricle has normal function. The left ventricle has no regional wall motion abnormalities. The left ventricular internal cavity size was normal in size. There is  severe concentric left ventricular hypertrophy. Left ventricular diastolic parameters are consistent with Grade I diastolic dysfunction (impaired relaxation). Right Ventricle: The right ventricular size is normal. No increase in right ventricular wall thickness. Right ventricular systolic function is normal. Left Atrium: Left atrial size was normal in size. Right Atrium: Right atrial size was normal in size. Pericardium: There is no evidence of pericardial effusion. Mitral Valve: The mitral valve is normal in structure. Trivial mitral valve regurgitation. No evidence of mitral valve  stenosis. MV peak gradient, 4.2 mmHg. The mean mitral valve gradient is 2.0 mmHg. Tricuspid Valve: The tricuspid valve is normal in structure. Tricuspid valve regurgitation is mild . No evidence of tricuspid stenosis. Aortic Valve: The aortic valve is normal in structure. Aortic valve regurgitation is mild. Aortic valve sclerosis/calcification is present, without any evidence of aortic stenosis. Aortic valve mean gradient measures 7.0 mmHg. Aortic valve peak gradient measures 10.6 mmHg. Aortic valve area, by VTI measures 2.72 cm. Pulmonic Valve: The pulmonic valve was normal in structure. Pulmonic valve regurgitation is not visualized. No evidence of pulmonic stenosis. Aorta: The aortic root is normal in size and structure. Venous: The inferior vena cava is normal in size with greater than 50% respiratory variability, suggesting right atrial pressure of 3 mmHg. IAS/Shunts: No atrial level shunt detected by color flow Doppler.  LEFT VENTRICLE PLAX 2D LVIDd:         3.87 cm   Diastology LVIDs:         2.24 cm   LV e' medial:    5.55 cm/s LV PW:         1.76 cm   LV E/e' medial:  15.1 LV  IVS:        1.65 cm   LV e' lateral:   8.59 cm/s LVOT diam:     1.80 cm   LV E/e' lateral: 9.8 LV SV:         71 LV SV Index:   37 LVOT Area:     2.54 cm  RIGHT VENTRICLE RV Basal diam:  3.68 cm RV S prime:     23.90 cm/s LEFT ATRIUM             Index        RIGHT ATRIUM           Index LA diam:        3.80 cm 1.97 cm/m   RA Area:     14.60 cm LA Vol (A2C):   46.9 ml 24.32 ml/m  RA Volume:   37.10 ml  19.24 ml/m LA Vol (A4C):   49.6 ml 25.72 ml/m LA Biplane Vol: 49.2 ml 25.52 ml/m  AORTIC VALVE                     PULMONIC VALVE AV Area (Vmax):    2.33 cm      PV Vmax:       1.42 m/s AV Area (Vmean):   2.25 cm      PV Vmean:      94.700 cm/s AV Area (VTI):     2.72 cm      PV VTI:        0.233 m AV Vmax:           163.00 cm/s   PV Peak grad:  8.1 mmHg AV Vmean:          122.000 cm/s  PV Mean grad:  4.0 mmHg AV VTI:            0.262 m AV Peak Grad:      10.6 mmHg AV Mean Grad:      7.0 mmHg LVOT Vmax:         149.00 cm/s LVOT Vmean:        108.000 cm/s LVOT VTI:          0.280 m LVOT/AV VTI ratio: 1.07  AORTA Ao Root diam: 3.60 cm MITRAL VALVE MV Area (PHT): 4.77 cm  SHUNTS MV Area VTI:   2.75 cm     Systemic VTI:  0.28 m MV Peak grad:  4.2 mmHg     Systemic Diam: 1.80 cm MV Mean grad:  2.0 mmHg MV Vmax:       1.03 m/s MV Vmean:      73.5 cm/s MV Decel Time: 159 msec MV E velocity: 83.90 cm/s MV A velocity: 103.00 cm/s MV E/A ratio:  0.81 Shaukat Khan Electronically signed by Neoma Laming Signature Date/Time: 04/25/2022/7:49:14 AM    Final     Microbiology: No results found for this or any previous visit (from the past 240 hour(s)).   Labs: Basic Metabolic Panel: Recent Labs  Lab 04/24/22 0903 04/25/22 0627  NA 135 137  K 4.3 4.3  CL 108 109  CO2 22 23  GLUCOSE 178* 253*  BUN 21* 27*  CREATININE 1.59* 1.64*  CALCIUM 8.5* 7.8*   Liver Function Tests: Recent Labs  Lab 04/24/22 0903  AST 17  ALT 12  ALKPHOS 78  BILITOT 0.7  PROT 7.2  ALBUMIN 2.8*   Recent Labs  Lab  04/24/22 0903  LIPASE 26   No results for input(s): AMMONIA in the last 168 hours. CBC: Recent Labs  Lab 04/24/22 0903 04/25/22 0627  WBC 5.3 5.6  NEUTROABS 2.9  --   HGB 11.4* 9.4*  HCT 35.1* 27.6*  MCV 87.1 84.9  PLT 246 227   Cardiac Enzymes: No results for input(s): CKTOTAL, CKMB, CKMBINDEX, TROPONINI in the last 168 hours. BNP: BNP (last 3 results) Recent Labs    04/24/22 0903  BNP 176.5*    ProBNP (last 3 results) No results for input(s): PROBNP in the last 8760 hours.  CBG: Recent Labs  Lab 04/24/22 1254 04/24/22 1604 04/24/22 2216  GLUCAP 189* 199* 262*       Signed:  Desma Maxim MD.  [facility] 04/25/2022, 8:36 AM

## 2022-04-25 NOTE — TOC Transition Note (Addendum)
Transition of Care [facility]) - CM/SW Discharge Note   Patient Details  Name: Wyatt Taylor MRN: [id number] Date of Birth: 1966-04-26  [facility] CM/SW Contact:  Laurena Slimmer, RN Phone Number: 04/25/2022, 12:55 PM   Clinical Narrative:    Volunteer services called to have medications picked up from [place] to deliver to inpatient pharmacy for discharge. Spoke with patient by phone. Patient states he hasa PCP in [place]. Unable to recall providers name but will call to arrange an appointment once he locates the information. TOC signing off         Patient Goals and CMS Choice        Discharge Placement                       Discharge Plan and Services                                     Social Determinants of Health (SDOH) Interventions     Readmission Risk Interventions     View : No data to display.

## 2022-04-25 NOTE — Consult Note (Signed)
[place] for IV Heparin Indication: pulmonary embolus  Patient Measurements: Height: 5\' 9"  (175.3 cm) Weight: 77.1 kg (170 lb) IBW/kg (Calculated) : 70.7 Heparin Dosing Weight: 77.1 kg  Labs: Recent Labs    04/24/22 0903 04/24/22 1611 04/24/22 1800 04/25/22 0020 04/25/22 0627  HGB 11.4*  --   --   --  9.4*  HCT 35.1*  --   --   --  27.6*  PLT 246  --   --   --  227  APTT  --  60*  --   --   --   LABPROT  --  14.0  --   --   --   INR  --  1.1  --   --   --   HEPARINUNFRC  --   --  0.39 0.26* 0.38  CREATININE 1.59*  --   --   --  1.64*  TROPONINIHS 19* 22*  --   --   --     Estimated Creatinine Clearance: 52.1 mL/min (A) (by C-G formula based on SCr of 1.64 mg/dL (H)).   Medical History: Past Medical History:  Diagnosis Date   Chronic renal failure, stage 3a (H[facility])    HTN (hypertension)    Type II diabetes mellitus with renal manifestations ([facility])     Medications:  No anticoagulation prior to admission per my chart review. Medication reconciliation is pending  Assessment: Patient is a 56 y/o M with medical history including DM who presented to the ED 6/4 with chest pain and subsequently found to have acute PE. Pharmacy consulted to initiate and manage IV heparin for PE treatment.   Baseline CBC notable for mild anemia. Baseline aPTT and PT-INR are pending.  Date Time HL Rate/comment 6/4 1800 0.39 1300 un/hr, therapeutic  6/5 0020 0.26 Subtherapeutic 6/5 0627 0.38 Therapeutic x1  Goal of Therapy:  Heparin level 0.3-0.7 units/ml Monitor platelets by anticoagulation protocol: Yes   Plan: HL therapeutic x1. Continue heparin infusion to 1450 units/hr Check confirmatory heparin level in 6 hours  Monitor daily CBC, s/s of bleed   Darrick Penna, PharmD 04/25/2022,7:52 AM

## 2022-04-25 NOTE — Plan of Care (Signed)

## 2022-04-25 NOTE — Consult Note (Signed)
[place] for IV Heparin Indication: pulmonary embolus  Patient Measurements: Height: 5\' 9"  (175.3 cm) Weight: 77.1 kg (170 lb) IBW/kg (Calculated) : 70.7 Heparin Dosing Weight: 77.1 kg  Labs: Recent Labs    04/24/22 0903 04/24/22 1611 04/24/22 1800 04/25/22 0020  HGB 11.4*  --   --   --   HCT 35.1*  --   --   --   PLT 246  --   --   --   APTT  --  60*  --   --   LABPROT  --  14.0  --   --   INR  --  1.1  --   --   HEPARINUNFRC  --   --  0.39 0.26*  CREATININE 1.59*  --   --   --   TROPONINIHS 19* 22*  --   --      Estimated Creatinine Clearance: 53.7 mL/min (A) (by C-G formula based on SCr of 1.59 mg/dL (H)).   Medical History: Past Medical History:  Diagnosis Date   Chronic renal failure, stage 3a (HCC)    HTN (hypertension)    Type II diabetes mellitus with renal manifestations (H[facility])     Medications:  No anticoagulation prior to admission per my chart review. Medication reconciliation is pending  Assessment: Patient is a 56 y/o M with medical history including DM who presented to the ED 6/4 with chest pain and subsequently found to have acute PE. Pharmacy consulted to initiate and manage IV heparin for PE treatment.   Baseline CBC notable for mild anemia. Baseline aPTT and PT-INR are pending.  Date Time HL Rate/comment 6/4 1800 0.39 1300 un/hr, therapeutic  6/5 0020 0.26 Subtherapeutic  Goal of Therapy:  Heparin level 0.3-0.7 units/ml Monitor platelets by anticoagulation protocol: Yes   Plan: Bolus 1200 units x 1 Increase heparin infusion to 1450 units/hr Check confirmatory heparin level in 6 hours  Monitor daily CBC, s/s of bleed   Lonell Face, PharmD 04/25/2022,12:49 AM

## 2022-04-26 LAB — HOMOCYSTEINE: Homocysteine: 22.4 umol/L — ABNORMAL HIGH (ref 0.0–14.5)

## 2022-04-27 LAB — CARDIOLIPIN ANTIBODIES, IGG, IGM, IGA
Anticardiolipin IgA: <9 APL U/mL (ref 0–11)
Anticardiolipin IgG: <9 GPL U/mL (ref 0–14)
Anticardiolipin IgM: <9 MPL U/mL (ref 0–12)

## 2022-04-27 LAB — PROTEIN S ACTIVITY: Protein S Activity: 124 % (ref 63–140)

## 2022-04-27 LAB — PROTEIN C ACTIVITY: Protein C Activity: 88 % (ref 73–180)

## 2022-04-27 LAB — BETA-2-GLYCOPROTEIN I ABS, IGG/M/A
Beta-2 Glyco I IgG: <9 GPI IgG units (ref 0–20)
Beta-2-Glycoprotein I IgA: <9 GPI IgA units (ref 0–25)
Beta-2-Glycoprotein I IgM: <9 GPI IgM units (ref 0–32)

## 2022-04-27 LAB — PROTEIN C, TOTAL: Protein C, Total: 79 % (ref 60–150)

## 2022-04-27 LAB — LUPUS ANTICOAGULANT PANEL
DRVVT: 39.2 s (ref 0.0–47.0)
PTT Lupus Anticoagulant: 59.1 s — ABNORMAL HIGH (ref 0.0–43.5)

## 2022-04-27 LAB — PROTEIN S, TOTAL: Protein S Ag, Total: 154 % — ABNORMAL HIGH (ref 60–150)

## 2022-04-27 LAB — HEXAGONAL PHASE PHOSPHOLIPID: Hexagonal Phase Phospholipid: 2 s (ref 0–11)

## 2022-04-27 LAB — PTT-LA MIX: PTT-LA Mix: 53 s — ABNORMAL HIGH (ref 0.0–40.5)

## 2022-05-02 LAB — FACTOR 5 LEIDEN

## 2022-05-02 LAB — PROTHROMBIN GENE MUTATION

## 2022-05-03 ENCOUNTER — Other Ambulatory Visit: Payer: Self-pay

## 2022-05-03 ENCOUNTER — Encounter: Payer: Self-pay | Admitting: Pharmacy Technician

## 2022-05-03 NOTE — Patient Outreach (Signed)
Patient not seeing a [facility] Provider.  Patient is seeing a provider at [facility].  Medications are provided through the [facility] which is comprised of [address], [place] and [place], [place].  Patient is to work with provider at [facility] to obtain medication assistance.  Sherilyn Dacosta Care Manager [facility]

## 2023-03-21 ENCOUNTER — Ambulatory Visit (HOSPITAL_COMMUNITY): Payer: Medicaid Other | Attending: Internal Medicine | Admitting: Physical Therapy

## 2023-03-21 ENCOUNTER — Encounter (HOSPITAL_COMMUNITY): Payer: Self-pay | Admitting: Physical Therapy

## 2023-03-21 DIAGNOSIS — L02612 Cutaneous abscess of left foot: Secondary | ICD-10-CM | POA: Insufficient documentation

## 2023-03-21 DIAGNOSIS — R2689 Other abnormalities of gait and mobility: Secondary | ICD-10-CM

## 2023-03-21 NOTE — Therapy (Signed)
OUTPATIENT PHYSICAL THERAPY Wound EVALUATION   Patient Name: Wyatt Taylor MRN: [id number] DOB:May 14, 1966, 57 y.o., male Today's Date: 03/21/2023   PCP: No PCP REFERRING PROVIDER: Alvina Filbert MD  END OF SESSION:  PT End of Session - 03/21/23 0737     Visit Number 1    Number of Visits 16    Date for PT Re-Evaluation 05/16/23    Authorization Type Medicaid [employer] community    Progress Note Due on Visit 10    PT Start Time 0737    PT Stop Time 0825    PT Time Calculation (min) 48 min    Activity Tolerance Patient tolerated treatment well    Behavior During Therapy WFL for tasks assessed/performed             Past Medical History:  Diagnosis Date   Chronic renal failure, stage 3a (HCC)    HTN (hypertension)    Type II diabetes mellitus with renal manifestations (HCC)    History reviewed. No pertinent surgical history. Patient Active Problem List   Diagnosis Date Noted   Acute pulmonary embolism (HCC) 04/24/2022   HTN (hypertension) 04/24/2022   Hypertensive urgency 04/24/2022   Chronic renal failure, stage 3a (HCC) 04/24/2022   Type II diabetes mellitus with renal manifestations (HCC) 04/24/2022   Elevated troponin 04/24/2022    ONSET DATE: 02/14/23  REFERRING DIAG: E11.10 (ICD-10-CM) - Type 2 diabetes mellitus with ketoacidosis without coma L02.612 (ICD-10-CM) - Cutaneous abscess of left foot  THERAPY DIAG:  Other abnormalities of gait and mobility  Abscess of left foot  Rationale for Evaluation and Treatment: Rehabilitation     Wound Therapy - 03/21/23 0001     Subjective Patient got a nail in his work boot and was wearing them for about a week on and off before he noticed wound. Spent 10 days admitted for antibiotics. Has a PICC line in for abx. Spouse has been doing dressing changes, was told to get a wound vac but homehealth would not accept their insurance. They have been doing wet to dry changes and wrapping with kerlix and ace    Patient and Family  Stated Goals wound to heal    Date of Onset 02/14/23    Prior Treatments antibiotics dressing changes    Pain Scale Faces    Faces Pain Scale Hurts a little bit    Evaluation and Treatment Procedures Explained to Patient/Family Yes    Evaluation and Treatment Procedures agreed to    Wound Properties Date First Assessed: 03/21/23 Time First Assessed: 0740 Wound Type: Diabetic ulcer Location: Heel Location Orientation: Left Wound Description (Comments): L plantar foot wound Present on Admission: Yes   Wound Image Images linked: 1    Dressing Type Gauze (Comment)    Dressing Changed Changed    Dressing Status Old drainage    Dressing Change Frequency PRN    Site / Wound Assessment Yellow;Red    % Wound base Red or Granulating 75%    % Wound base Yellow/Fibrinous Exudate 25%    Peri-wound Assessment Maceration;Other (Comment);Edema   callus   Wound Length (cm) 12.3 cm    Wound Width (cm) 1.5 cm    Wound Depth (cm) 1.3 cm    Wound Volume (cm^3) 23.99 cm^3    Wound Surface Area (cm^2) 18.45 cm^2    Margins Unattached edges (unapproximated)    Drainage Amount Minimal    Drainage Description Serous    Treatment Cleansed;Debridement (Selective)    Selective Debridement (non-excisional) - Location L  foot wound and periwound    Selective Debridement (non-excisional) - Tools Used Forceps;Scissors    Selective Debridement (non-excisional) - Tissue Removed callus, slough    Wound Therapy - Clinical Statement Patient with foot wound caused by nail in his shoe causing abscess in foot. He spent 10 days admitted for IV antibiotcs and debridment. Patient with continued PICC line and antibiotics. Patient's wound with calloused periwound and masceration/dead tissue. Patient with mostly red wound bed with some areas of slough, visable tendon. Debrided slough and callous as able. applied vaseline to periwound to protect skin and soften callous. Continued packing wound with 3'' conform wet with sterile water  followed by 4x4s, kerlix, tape to secure and netting.    Wound Therapy - Functional Problem List ambulation    Factors Delaying/Impairing Wound Healing Diabetes Mellitus;Altered sensation;Infection - systemic/local;Immobility;Multiple medical problems;Tobacco use    Hydrotherapy Plan Dressing change;Debridement;Electrical stimulation;Patient/family education;Pulsatile lavage with suction;Ultrasonic wound therapy @35  KHz (+/- 3 KHz)    Wound Therapy - Frequency 2X / week    Wound Therapy - Current Recommendations PT    Wound Plan debride and dressing changes, possibly change to silver hydrofiber packing if needed    Dressing  sterile water on 3" conform packed into wound, 4x 4s kerlix, kerlix, netting               PATIENT EDUCATION: Education details: Educated on dressing changes if soiled/drains through, POC, scope of PT Person educated: Patient and Spouse Education method: Explanation Education comprehension: verbalized understanding   HOME EXERCISE PROGRAM: N/a   GOALS: Goals reviewed with patient? Yes  SHORT TERM GOALS: Target date: 04/18/2023    Patient wound to decrease in size by 50% to demonstrate wound healing. Baseline: Goal status: INITIAL   LONG TERM GOALS: Target date: 05/16/2023    Patients wound be healed to reduce risk of infection. Baseline:  Goal status: INITIAL  2.  Patient will verbalize proper skin care techniques in order to reduce risk of further wound development.  Baseline:  Goal status: INITIAL    ASSESSMENT:  CLINICAL IMPRESSION: See above   OBJECTIVE IMPAIRMENTS: Abnormal gait, decreased activity tolerance, decreased balance, decreased endurance, decreased mobility, difficulty walking, decreased ROM, decreased strength, increased edema, impaired flexibility, impaired sensation, improper body mechanics, and pain.   ACTIVITY LIMITATIONS: carrying, lifting, bending, standing, stairs, transfers, bathing, dressing, hygiene/grooming,  locomotion level, and caring for others  PARTICIPATION LIMITATIONS: meal prep, cleaning, laundry, shopping, community activity, occupation, and yard work  PERSONAL FACTORS: Fitness, Time since onset of injury/illness/exacerbation, and 3+ comorbidities: DM, smokes, increased BMI, HTN  are also affecting patient's functional outcome.   REHAB POTENTIAL: Fair    CLINICAL DECISION MAKING: Unstable/unpredictable  EVALUATION COMPLEXITY: High  PLAN: PT FREQUENCY: 2x/week  PT DURATION: 8 weeks  PLANNED INTERVENTIONS: Therapeutic exercises, Therapeutic activity, Neuromuscular re-education, Balance training, Gait training, Patient/Family education, Joint manipulation, Joint mobilization, Stair training, Orthotic/Fit training, DME instructions, Aquatic Therapy, Dry Needling, Electrical stimulation, Spinal manipulation, Spinal mobilization, Cryotherapy, Moist heat, Compression bandaging, scar mobilization, Splintting, Taping, Traction, Ultrasound, Ionotophoresis 4mg /ml Dexamethasone, and Manual therapy   PLAN FOR NEXT SESSION: debride and dressing changes PRN   Wyman Songster, PT 03/21/2023, 3:16 PM

## 2023-03-23 ENCOUNTER — Ambulatory Visit (HOSPITAL_COMMUNITY): Payer: Medicaid Other | Admitting: Physical Therapy

## 2023-03-24 ENCOUNTER — Ambulatory Visit (HOSPITAL_COMMUNITY): Payer: Medicaid Other | Attending: Internal Medicine

## 2023-03-24 ENCOUNTER — Encounter (HOSPITAL_COMMUNITY): Payer: Self-pay

## 2023-03-24 DIAGNOSIS — R2689 Other abnormalities of gait and mobility: Secondary | ICD-10-CM | POA: Diagnosis present

## 2023-03-24 DIAGNOSIS — L02612 Cutaneous abscess of left foot: Secondary | ICD-10-CM

## 2023-03-24 NOTE — Therapy (Signed)
OUTPATIENT PHYSICAL THERAPY Wound EVALUATION   Patient Name: Wyatt Taylor MRN: [id number] DOB:[birth year], 57 y.o., male Today's Date: 03/24/2023   PCP: No PCP REFERRING PROVIDER: Alvina Filbert MD  END OF SESSION:  PT End of Session - 03/24/23 0923     Visit Number 2    Number of Visits 16    Date for PT Re-Evaluation 05/16/23    Authorization Type Medicaid [employer] community    Progress Note Due on Visit 10    PT Start Time 0818    PT Stop Time 0858    PT Time Calculation (min) 40 min    Activity Tolerance Patient tolerated treatment well    Behavior During Therapy WFL for tasks assessed/performed             Past Medical History:  Diagnosis Date   Chronic renal failure, stage 3a (HCC)    HTN (hypertension)    Type II diabetes mellitus with renal manifestations (HCC)    History reviewed. No pertinent surgical history. Patient Active Problem List   Diagnosis Date Noted   Acute pulmonary embolism (HCC) 04/24/2022   HTN (hypertension) 04/24/2022   Hypertensive urgency 04/24/2022   Chronic renal failure, stage 3a (HCC) 04/24/2022   Type II diabetes mellitus with renal manifestations (HCC) 04/24/2022   Elevated troponin 04/24/2022    ONSET DATE: 02/14/23  REFERRING DIAG: E11.10 (ICD-10-CM) - Type 2 diabetes mellitus with ketoacidosis without coma L02.612 (ICD-10-CM) - Cutaneous abscess of left foot  THERAPY DIAG:  Other abnormalities of gait and mobility  Abscess of left foot  Rationale for Evaluation and Treatment: Rehabilitation     Wound Therapy - 03/24/23 0001     Subjective Went to [facility] yesterday, arrived with new dressings.  Plans to remove PICC line next Thursday.    Patient and Family Stated Goals wound to heal    Date of Onset 02/14/23    Prior Treatments antibiotics dressing changes    Pain Scale 0-10    Pain Score 0-No pain    Evaluation and Treatment Procedures Explained to Patient/Family Yes    Evaluation and Treatment Procedures agreed  to    Wound Properties Date First Assessed: 03/21/23 Time First Assessed: 0740 Wound Type: Diabetic ulcer Location: Heel Location Orientation: Left Wound Description (Comments): L plantar foot wound Present on Admission: Yes   Dressing Type Gauze (Comment);Moist to dry    Dressing Changed Changed    Dressing Status Old drainage    Dressing Change Frequency PRN    Site / Wound Assessment Yellow;Red    % Wound base Red or Granulating 75%    % Wound base Yellow/Fibrinous Exudate 25%    Peri-wound Assessment Maceration;Other (Comment);Edema   callous perimeter   Margins Epibole (rolled edges)    Drainage Amount Minimal    Drainage Description Serous    Treatment Cleansed;Debridement (Selective)    Selective Debridement (non-excisional) - Location L foot wound and periwound    Selective Debridement (non-excisional) - Tools Used Forceps;Scissors    Selective Debridement (non-excisional) - Tissue Removed callus, slough    Wound Therapy - Clinical Statement Selective debridement wtih focus on callous perimeter and epibole edges to promote healing of wound,  Continued with wet to dry 2" gauze, ABD, kerlix and netting with vaseline perimeter to soften callous.    Wound Therapy - Functional Problem List ambulation    Factors Delaying/Impairing Wound Healing Diabetes Mellitus;Altered sensation;Infection - systemic/local;Immobility;Multiple medical problems;Tobacco use    Hydrotherapy Plan Dressing change;Debridement;Electrical stimulation;Patient/family education;Pulsatile lavage with  suction;Ultrasonic wound therapy @35  KHz (+/- 3 KHz)    Wound Therapy - Frequency 2X / week    Wound Therapy - Current Recommendations PT    Wound Plan debride and dressing changes, possibly change to silver hydrofiber packing if needed    Dressing  sterile water on 2" conform packed into wound, 4x 4s kerlix,  netting               PATIENT EDUCATION: Education details: Educated on dressing changes if  soiled/drains through, POC, scope of PT Person educated: Patient and Spouse Education method: Explanation Education comprehension: verbalized understanding   HOME EXERCISE PROGRAM: N/a   GOALS: Goals reviewed with patient? Yes  SHORT TERM GOALS: Target date: 04/18/2023    Patient wound to decrease in size by 50% to demonstrate wound healing. Baseline: Goal status: INITIAL   LONG TERM GOALS: Target date: 05/16/2023    Patients wound be healed to reduce risk of infection. Baseline:  Goal status: INITIAL  2.  Patient will verbalize proper skin care techniques in order to reduce risk of further wound development.  Baseline:  Goal status: INITIAL    ASSESSMENT:  CLINICAL IMPRESSION: See above   OBJECTIVE IMPAIRMENTS: Abnormal gait, decreased activity tolerance, decreased balance, decreased endurance, decreased mobility, difficulty walking, decreased ROM, decreased strength, increased edema, impaired flexibility, impaired sensation, improper body mechanics, and pain.   ACTIVITY LIMITATIONS: carrying, lifting, bending, standing, stairs, transfers, bathing, dressing, hygiene/grooming, locomotion level, and caring for others  PARTICIPATION LIMITATIONS: meal prep, cleaning, laundry, shopping, community activity, occupation, and yard work  PERSONAL FACTORS: Fitness, Time since onset of injury/illness/exacerbation, and 3+ comorbidities: DM, smokes, increased BMI, HTN  are also affecting patient's functional outcome.   REHAB POTENTIAL: Fair    CLINICAL DECISION MAKING: Unstable/unpredictable  EVALUATION COMPLEXITY: High  PLAN: PT FREQUENCY: 2x/week  PT DURATION: 8 weeks  PLANNED INTERVENTIONS: Therapeutic exercises, Therapeutic activity, Neuromuscular re-education, Balance training, Gait training, Patient/Family education, Joint manipulation, Joint mobilization, Stair training, Orthotic/Fit training, DME instructions, Aquatic Therapy, Dry Needling, Electrical stimulation,  Spinal manipulation, Spinal mobilization, Cryotherapy, Moist heat, Compression bandaging, scar mobilization, Splintting, Taping, Traction, Ultrasound, Ionotophoresis 4mg /ml Dexamethasone, and Manual therapy   PLAN FOR NEXT SESSION: debride and dressing changes PRN  Becky Sax, LPTA/CLT; CBIS [phone]  Juel Burrow, PTA 03/24/2023, 9:24 AM

## 2023-03-27 ENCOUNTER — Encounter (HOSPITAL_COMMUNITY): Payer: Self-pay | Admitting: Physical Therapy

## 2023-03-27 ENCOUNTER — Ambulatory Visit (HOSPITAL_COMMUNITY): Payer: Medicaid Other | Admitting: Physical Therapy

## 2023-03-27 DIAGNOSIS — R2689 Other abnormalities of gait and mobility: Secondary | ICD-10-CM | POA: Diagnosis not present

## 2023-03-27 DIAGNOSIS — L02612 Cutaneous abscess of left foot: Secondary | ICD-10-CM

## 2023-03-27 NOTE — Therapy (Signed)
OUTPATIENT PHYSICAL THERAPY Wound Treatment   Patient Name: Wyatt Taylor MRN: [id number] DOB:01-02-1966, 57 y.o., male Today's Date: 03/27/2023   PCP: No PCP REFERRING PROVIDER: Alvina Filbert MD  END OF SESSION:  PT End of Session - 03/27/23 0947     Visit Number 3    Number of Visits 16    Date for PT Re-Evaluation 05/16/23    Authorization Type Medicaid [employer] community    Progress Note Due on Visit 10    PT Start Time 0947    PT Stop Time 1033    PT Time Calculation (min) 46 min    Activity Tolerance Patient tolerated treatment well    Behavior During Therapy WFL for tasks assessed/performed             Past Medical History:  Diagnosis Date   Chronic renal failure, stage 3a (HCC)    HTN (hypertension)    Type II diabetes mellitus with renal manifestations (HCC)    History reviewed. No pertinent surgical history. Patient Active Problem List   Diagnosis Date Noted   Acute pulmonary embolism (HCC) 04/24/2022   HTN (hypertension) 04/24/2022   Hypertensive urgency 04/24/2022   Chronic renal failure, stage 3a (HCC) 04/24/2022   Type II diabetes mellitus with renal manifestations (HCC) 04/24/2022   Elevated troponin 04/24/2022    ONSET DATE: 02/14/23  REFERRING DIAG: E11.10 (ICD-10-CM) - Type 2 diabetes mellitus with ketoacidosis without coma L02.612 (ICD-10-CM) - Cutaneous abscess of left foot  THERAPY DIAG:  Other abnormalities of gait and mobility  Abscess of left foot  Rationale for Evaluation and Treatment: Rehabilitation     Wound Therapy - 03/27/23 0001     Subjective No issues with foot    Patient and Family Stated Goals wound to heal    Date of Onset 02/14/23    Prior Treatments antibiotics dressing changes    Pain Score 0-No pain    Evaluation and Treatment Procedures Explained to Patient/Family Yes    Evaluation and Treatment Procedures agreed to    Wound Properties Date First Assessed: 03/21/23 Time First Assessed: 0740 Wound Type: Diabetic  ulcer Location: Heel Location Orientation: Left Wound Description (Comments): L plantar foot wound Present on Admission: Yes   Dressing Type Gauze (Comment);Moist to dry    Dressing Changed Changed    Dressing Status Old drainage    Dressing Change Frequency PRN    Site / Wound Assessment Yellow;Red    % Wound base Red or Granulating 85%    % Wound base Yellow/Fibrinous Exudate 15%    Peri-wound Assessment Maceration;Other (Comment);Edema   callous perimeter   Margins Epibole (rolled edges)    Drainage Amount Minimal    Drainage Description Serous    Treatment Cleansed;Debridement (Selective)    Selective Debridement (non-excisional) - Location L foot wound and periwound    Selective Debridement (non-excisional) - Tools Used Forceps;Scissors    Selective Debridement (non-excisional) - Tissue Removed callus, slough    Wound Therapy - Clinical Statement continued with debridement of a little slough and margins as there is extensive callous. Epibole margins as well. Tolerates debridement well. Continued with wet to dry 2" gauze,  kerlix and netting with vaseline perimeter to soften callous.    Wound Therapy - Functional Problem List ambulation    Factors Delaying/Impairing Wound Healing Diabetes Mellitus;Altered sensation;Infection - systemic/local;Immobility;Multiple medical problems;Tobacco use    Hydrotherapy Plan Dressing change;Debridement;Electrical stimulation;Patient/family education;Pulsatile lavage with suction;Ultrasonic wound therapy @35  KHz (+/- 3 KHz)    Wound Therapy -  Frequency 2X / week    Wound Therapy - Current Recommendations PT    Wound Plan debride and dressing changes, possibly change to silver hydrofiber packing if needed    Dressing  sterile water on 2" conform packed into wound, 4x 4s kerlix,  netting               PATIENT EDUCATION: Education details: Educated on dressing changes if soiled/drains through, POC, scope of PT Person educated: Patient and  Spouse Education method: Explanation Education comprehension: verbalized understanding   HOME EXERCISE PROGRAM: N/a   GOALS: Goals reviewed with patient? Yes  SHORT TERM GOALS: Target date: 04/18/2023    Patient wound to decrease in size by 50% to demonstrate wound healing. Baseline: Goal status: INITIAL   LONG TERM GOALS: Target date: 05/16/2023    Patients wound be healed to reduce risk of infection. Baseline:  Goal status: INITIAL  2.  Patient will verbalize proper skin care techniques in order to reduce risk of further wound development.  Baseline:  Goal status: INITIAL    ASSESSMENT:  CLINICAL IMPRESSION: See above   OBJECTIVE IMPAIRMENTS: Abnormal gait, decreased activity tolerance, decreased balance, decreased endurance, decreased mobility, difficulty walking, decreased ROM, decreased strength, increased edema, impaired flexibility, impaired sensation, improper body mechanics, and pain.   ACTIVITY LIMITATIONS: carrying, lifting, bending, standing, stairs, transfers, bathing, dressing, hygiene/grooming, locomotion level, and caring for others  PARTICIPATION LIMITATIONS: meal prep, cleaning, laundry, shopping, community activity, occupation, and yard work  PERSONAL FACTORS: Fitness, Time since onset of injury/illness/exacerbation, and 3+ comorbidities: DM, smokes, increased BMI, HTN  are also affecting patient's functional outcome.   REHAB POTENTIAL: Fair    CLINICAL DECISION MAKING: Unstable/unpredictable  EVALUATION COMPLEXITY: High  PLAN: PT FREQUENCY: 2x/week  PT DURATION: 8 weeks  PLANNED INTERVENTIONS: Therapeutic exercises, Therapeutic activity, Neuromuscular re-education, Balance training, Gait training, Patient/Family education, Joint manipulation, Joint mobilization, Stair training, Orthotic/Fit training, DME instructions, Aquatic Therapy, Dry Needling, Electrical stimulation, Spinal manipulation, Spinal mobilization, Cryotherapy, Moist heat,  Compression bandaging, scar mobilization, Splintting, Taping, Traction, Ultrasound, Ionotophoresis 4mg /ml Dexamethasone, and Manual therapy   PLAN FOR NEXT SESSION: debride and dressing changes PRN    Wyman Songster, PT 03/27/2023, 12:28 PM

## 2023-03-29 ENCOUNTER — Ambulatory Visit (HOSPITAL_COMMUNITY): Payer: Medicaid Other | Admitting: Physical Therapy

## 2023-03-29 DIAGNOSIS — R2689 Other abnormalities of gait and mobility: Secondary | ICD-10-CM

## 2023-03-29 DIAGNOSIS — L02612 Cutaneous abscess of left foot: Secondary | ICD-10-CM

## 2023-03-29 NOTE — Therapy (Signed)
OUTPATIENT PHYSICAL THERAPY Wound Treatment   Patient Name: Wyatt Taylor MRN: [id number] DOB:Jun 12, 1966, 57 y.o., male Today's Date: 03/29/2023   PCP: No PCP REFERRING PROVIDER: Alvina Filbert MD  END OF SESSION:  PT End of Session - 03/29/23 1537     Visit Number 4    Number of Visits 16    Date for PT Re-Evaluation 05/16/23    Authorization Type Medicaid [employer] community    Progress Note Due on Visit 10    PT Start Time 1300    PT Stop Time 1340    PT Time Calculation (min) 40 min    Activity Tolerance Patient tolerated treatment well    Behavior During Therapy WFL for tasks assessed/performed             Past Medical History:  Diagnosis Date   Chronic renal failure, stage 3a (HCC)    HTN (hypertension)    Type II diabetes mellitus with renal manifestations (HCC)    No past surgical history on file. Patient Active Problem List   Diagnosis Date Noted   Acute pulmonary embolism (HCC) 04/24/2022   HTN (hypertension) 04/24/2022   Hypertensive urgency 04/24/2022   Chronic renal failure, stage 3a (HCC) 04/24/2022   Type II diabetes mellitus with renal manifestations (HCC) 04/24/2022   Elevated troponin 04/24/2022    ONSET DATE: 02/14/23  REFERRING DIAG: E11.10 (ICD-10-CM) - Type 2 diabetes mellitus with ketoacidosis without coma L02.612 (ICD-10-CM) - Cutaneous abscess of left foot  THERAPY DIAG:  Other abnormalities of gait and mobility  Abscess of left foot  Rationale for Evaluation and Treatment: Rehabilitation     Wound Therapy - 03/29/23 1538     Subjective pt wearing CAM walker.  Comes with wife today.    Patient and Family Stated Goals wound to heal    Date of Onset 02/14/23    Prior Treatments antibiotics dressing changes    Pain Score 0-No pain    Evaluation and Treatment Procedures Explained to Patient/Family Yes    Evaluation and Treatment Procedures agreed to    Wound Properties Date First Assessed: 03/21/23 Time First Assessed: 0740 Wound Type:  Diabetic ulcer Location: Heel Location Orientation: Left Wound Description (Comments): L plantar foot wound Present on Admission: Yes   Dressing Type Gauze (Comment);Moist to dry    Dressing Changed Changed    Dressing Status Old drainage    Dressing Change Frequency PRN    Site / Wound Assessment Yellow;Red    % Wound base Red or Granulating 90%    % Wound base Other/Granulation Tissue (Comment) 10%    Peri-wound Assessment Other (Comment)   callous perimeter   Margins Epibole (rolled edges)    Drainage Amount Scant    Drainage Description Serous    Treatment Cleansed;Debridement (Selective)    Selective Debridement (non-excisional) - Location L foot wound and periwound    Selective Debridement (non-excisional) - Tools Used Forceps;Scalpel    Selective Debridement (non-excisional) - Tissue Removed callus, devitalized tissue, epibole    Wound Therapy - Clinical Statement see below    Wound Therapy - Functional Problem List ambulation    Factors Delaying/Impairing Wound Healing Diabetes Mellitus;Altered sensation;Infection - systemic/local;Immobility;Multiple medical problems;Tobacco use    Hydrotherapy Plan Dressing change;Debridement;Electrical stimulation;Patient/family education;Pulsatile lavage with suction;Ultrasonic wound therapy @35  KHz (+/- 3 KHz)    Wound Therapy - Frequency 2X / week    Wound Therapy - Current Recommendations PT    Wound Plan debride and dressing changes with dressing to promote correct healing  environment.    Dressing  xerform in wound bed on granulated tissue, wet gauze and hyrdrogel over packed into wound bed, vaseline applied to perimter, 4X4 and ABD, kerlix and #5 netting               PATIENT EDUCATION: Education details: Educated on dressing changes if soiled/drains through, POC, scope of PT Person educated: Patient and Spouse Education method: Explanation Education comprehension: verbalized understanding   HOME EXERCISE  PROGRAM: N/a   GOALS: Goals reviewed with patient? Yes  SHORT TERM GOALS: Target date: 04/18/2023    Patient wound to decrease in size by 50% to demonstrate wound healing. Baseline: Goal status: INITIAL   LONG TERM GOALS: Target date: 05/16/2023    Patients wound be healed to reduce risk of infection. Baseline:  Goal status: INITIAL  2.  Patient will verbalize proper skin care techniques in order to reduce risk of further wound development.  Baseline:  Goal status: INITIAL    ASSESSMENT:  CLINICAL IMPRESSION: Pt with drainage through dressing, however appears was intially and dressing is dried.  Gauze was adhered into wound bed requiring extra water into wound using syringe to moisten for removal.  Cleased wound well prior to and after debridement.  Wound bed is 90 percent granulated with remainder of wound bed absent of slough or healthy tissue.  Skin is hard, no perfusion at the most distal end of foot.  Changed dressing to xeroform followed by saline and hydrogel soaked gauze into woundbed to maintain a moist environment.  ABD pad placed over for drainage if begins to increase again.    OBJECTIVE IMPAIRMENTS: Abnormal gait, decreased activity tolerance, decreased balance, decreased endurance, decreased mobility, difficulty walking, decreased ROM, decreased strength, increased edema, impaired flexibility, impaired sensation, improper body mechanics, and pain.   ACTIVITY LIMITATIONS: carrying, lifting, bending, standing, stairs, transfers, bathing, dressing, hygiene/grooming, locomotion level, and caring for others  PARTICIPATION LIMITATIONS: meal prep, cleaning, laundry, shopping, community activity, occupation, and yard work  PERSONAL FACTORS: Fitness, Time since onset of injury/illness/exacerbation, and 3+ comorbidities: DM, smokes, increased BMI, HTN  are also affecting patient's functional outcome.   REHAB POTENTIAL: Fair    CLINICAL DECISION MAKING:  Unstable/unpredictable  EVALUATION COMPLEXITY: High  PLAN: PT FREQUENCY: 2x/week  PT DURATION: 8 weeks  PLANNED INTERVENTIONS: Therapeutic exercises, Therapeutic activity, Neuromuscular re-education, Balance training, Gait training, Patient/Family education, Joint manipulation, Joint mobilization, Stair training, Orthotic/Fit training, DME instructions, Aquatic Therapy, Dry Needling, Electrical stimulation, Spinal manipulation, Spinal mobilization, Cryotherapy, Moist heat, Compression bandaging, scar mobilization, Splintting, Taping, Traction, Ultrasound, Ionotophoresis 4mg /ml Dexamethasone, and Manual therapy   PLAN FOR NEXT SESSION: debride and dressing changes PRN  Lurena Nida, PTA/CLT [facility] Ph: [phone]   Lurena Nida, PTA 03/29/2023, 3:54 PM

## 2023-04-03 ENCOUNTER — Ambulatory Visit (HOSPITAL_COMMUNITY): Payer: Medicaid Other | Admitting: Physical Therapy

## 2023-04-03 DIAGNOSIS — R2689 Other abnormalities of gait and mobility: Secondary | ICD-10-CM | POA: Diagnosis not present

## 2023-04-03 DIAGNOSIS — L02612 Cutaneous abscess of left foot: Secondary | ICD-10-CM

## 2023-04-03 NOTE — Therapy (Signed)
OUTPATIENT PHYSICAL THERAPY Wound Treatment   Patient Name: Wyatt Taylor MRN: [id number] DOB:[birth year], 57 y.o., male Today's Date: 04/03/2023   PCP: No PCP REFERRING PROVIDER: Alvina Filbert MD  END OF SESSION:  PT End of Session - 04/03/23 0830     Visit Number 5    Number of Visits 16    Date for PT Re-Evaluation 05/16/23    Authorization Type Medicaid [employer] community    Progress Note Due on Visit 10    PT Start Time 0735    PT Stop Time 0800    PT Time Calculation (min) 25 min    Activity Tolerance Patient tolerated treatment well    Behavior During Therapy WFL for tasks assessed/performed             Past Medical History:  Diagnosis Date   Chronic renal failure, stage 3a (HCC)    HTN (hypertension)    Type II diabetes mellitus with renal manifestations (HCC)    No past surgical history on file. Patient Active Problem List   Diagnosis Date Noted   Acute pulmonary embolism (HCC) 04/24/2022   HTN (hypertension) 04/24/2022   Hypertensive urgency 04/24/2022   Chronic renal failure, stage 3a (HCC) 04/24/2022   Type II diabetes mellitus with renal manifestations (HCC) 04/24/2022   Elevated troponin 04/24/2022    ONSET DATE: 02/14/23  REFERRING DIAG: E11.10 (ICD-10-CM) - Type 2 diabetes mellitus with ketoacidosis without coma L02.612 (ICD-10-CM) - Cutaneous abscess of left foot  THERAPY DIAG:  Other abnormalities of gait and mobility  Abscess of left foot  Rationale for Evaluation and Treatment: Rehabilitation     Wound Therapy - 04/03/23 0831     Subjective went back to MD on Thursday and pleased with progress.  Wife states she is changing it every other day as MD states it needs to be changed every 2 days.  No pain or issues currently.    Patient and Family Stated Goals wound to heal    Date of Onset 02/14/23    Prior Treatments antibiotics dressing changes    Evaluation and Treatment Procedures Explained to Patient/Family Yes    Evaluation and  Treatment Procedures agreed to    Wound Properties Date First Assessed: 03/21/23 Time First Assessed: 0740 Wound Type: Diabetic ulcer Location: Heel Location Orientation: Left Wound Description (Comments): L plantar foot wound Present on Admission: Yes   Dressing Type Gauze (Comment);Moist to dry    Dressing Status Old drainage    Dressing Change Frequency PRN    Site / Wound Assessment Yellow;Red    Peri-wound Assessment Other (Comment)   callous perimeter   Wound Length (cm) 13.5 cm    Wound Width (cm) 1 cm    Wound Depth (cm) 1.3 cm    Wound Volume (cm^3) 17.55 cm^3    Wound Surface Area (cm^2) 13.5 cm^2    Margins Epibole (rolled edges)    Drainage Amount Scant    Drainage Description Serous    Treatment Cleansed;Debridement (Selective)    Selective Debridement (non-excisional) - Location L foot wound and periwound    Selective Debridement (non-excisional) - Tools Used Forceps;Scalpel    Selective Debridement (non-excisional) - Tissue Removed callus, devitalized tissue, epibole    Wound Therapy - Clinical Statement see below    Wound Therapy - Functional Problem List ambulation    Factors Delaying/Impairing Wound Healing Diabetes Mellitus;Altered sensation;Infection - systemic/local;Immobility;Multiple medical problems;Tobacco use    Hydrotherapy Plan Dressing change;Debridement;Electrical stimulation;Patient/family education;Pulsatile lavage with suction;Ultrasonic wound therapy @35  KHz (+/-  3 KHz)    Wound Therapy - Frequency 2X / week    Wound Therapy - Current Recommendations PT    Wound Plan debride and dressing changes with dressing to promote correct healing environment.    Dressing  xerform in wound bed on granulated tissue, wet gauze and hyrdrogel over packed into wound bed, vaseline applied to perimter, 4X4 and ABD, kerlix and #5 netting               PATIENT EDUCATION: Education details: Educated on dressing changes if soiled/drains through, POC, scope of  PT Person educated: Patient and Spouse Education method: Explanation Education comprehension: verbalized understanding   HOME EXERCISE PROGRAM: N/a   GOALS: Goals reviewed with patient? Yes  SHORT TERM GOALS: Target date: 04/18/2023    Patient wound to decrease in size by 50% to demonstrate wound healing. Baseline: Goal status: INITIAL   LONG TERM GOALS: Target date: 05/16/2023    Patients wound be healed to reduce risk of infection. Baseline:  Goal status: INITIAL  2.  Patient will verbalize proper skin care techniques in order to reduce risk of further wound development.  Baseline:  Goal status: INITIAL    ASSESSMENT:  CLINICAL IMPRESSION: Dressing easily removed with approximation and filling in of most proximal portion of wound visualized.  Wound measured with 0.5cm approximation in width. No change in length or depth. Most distal end skin is peeling away. No drainage through dressing today, overall reduced and no odor.  Cleansed wound well prior to and after debridement.  Wound bed is 95 percent granulated with remainder of wound bed absent of slough.  More tissue coverage of exposed tendon at distal end.  Packed with hydrogel soaked gauze into woundbed to maintain a moist environment.  ABD pad placed over for drainage if begins to increase again.    OBJECTIVE IMPAIRMENTS: Abnormal gait, decreased activity tolerance, decreased balance, decreased endurance, decreased mobility, difficulty walking, decreased ROM, decreased strength, increased edema, impaired flexibility, impaired sensation, improper body mechanics, and pain.   ACTIVITY LIMITATIONS: carrying, lifting, bending, standing, stairs, transfers, bathing, dressing, hygiene/grooming, locomotion level, and caring for others  PARTICIPATION LIMITATIONS: meal prep, cleaning, laundry, shopping, community activity, occupation, and yard work  PERSONAL FACTORS: Fitness, Time since onset of injury/illness/exacerbation, and  3+ comorbidities: DM, smokes, increased BMI, HTN  are also affecting patient's functional outcome.   REHAB POTENTIAL: Fair    CLINICAL DECISION MAKING: Unstable/unpredictable  EVALUATION COMPLEXITY: High  PLAN: PT FREQUENCY: 2x/week  PT DURATION: 8 weeks  PLANNED INTERVENTIONS: Therapeutic exercises, Therapeutic activity, Neuromuscular re-education, Balance training, Gait training, Patient/Family education, Joint manipulation, Joint mobilization, Stair training, Orthotic/Fit training, DME instructions, Aquatic Therapy, Dry Needling, Electrical stimulation, Spinal manipulation, Spinal mobilization, Cryotherapy, Moist heat, Compression bandaging, scar mobilization, Splintting, Taping, Traction, Ultrasound, Ionotophoresis 4mg /ml Dexamethasone, and Manual therapy   PLAN FOR NEXT SESSION: debride and dressing changes PRN.  Measure and photograph weekly.  Lurena Nida, PTA/CLT [facility] Ph: [phone]   Lurena Nida, PTA 04/03/2023, 8:35 AM

## 2023-04-06 ENCOUNTER — Ambulatory Visit (HOSPITAL_COMMUNITY): Payer: Medicaid Other | Admitting: Physical Therapy

## 2023-04-06 DIAGNOSIS — L02612 Cutaneous abscess of left foot: Secondary | ICD-10-CM

## 2023-04-06 DIAGNOSIS — R2689 Other abnormalities of gait and mobility: Secondary | ICD-10-CM | POA: Diagnosis not present

## 2023-04-06 NOTE — Therapy (Signed)
OUTPATIENT PHYSICAL THERAPY Wound Treatment   Patient Name: Wyatt Taylor MRN: [id number] DOB:[birth year], 57 y.o., male Today's Date: 04/06/2023   PCP: No PCP REFERRING PROVIDER: Alvina Filbert MD  END OF SESSION:  PT End of Session - 04/06/23 0936     Visit Number 6    Number of Visits 16    Date for PT Re-Evaluation 05/16/23    Authorization Type Medicaid [employer]HC community    Progress Note Due on Visit 10    PT Start Time 0903    PT Stop Time 0933    PT Time Calculation (min) 30 min    Activity Tolerance Patient tolerated treatment well    Behavior During Therapy WFL for tasks assessed/performed             Past Medical History:  Diagnosis Date   Chronic renal failure, stage 3a (HCC)    HTN (hypertension)    Type II diabetes mellitus with renal manifestations (HCC)    No past surgical history on file. Patient Active Problem List   Diagnosis Date Noted   Acute pulmonary embolism (HCC) 04/24/2022   HTN (hypertension) 04/24/2022   Hypertensive urgency 04/24/2022   Chronic renal failure, stage 3a (HCC) 04/24/2022   Type II diabetes mellitus with renal manifestations (HCC) 04/24/2022   Elevated troponin 04/24/2022    ONSET DATE: 02/14/23  REFERRING DIAG: E11.10 (ICD-10-CM) - Type 2 diabetes mellitus with ketoacidosis without coma L02.612 (ICD-10-CM) - Cutaneous abscess of left foot  THERAPY DIAG:  Other abnormalities of gait and mobility  Abscess of left foot  Rationale for Evaluation and Treatment: Rehabilitation     Wound Therapy - 04/06/23 0937     Subjective no issues today.    Patient and Family Stated Goals wound to heal    Date of Onset 02/14/23    Prior Treatments antibiotics dressing changes    Evaluation and Treatment Procedures Explained to Patient/Family Yes    Evaluation and Treatment Procedures agreed to    Wound Properties Date First Assessed: 03/21/23 Time First Assessed: 0740 Wound Type: Diabetic ulcer Location: Heel Location Orientation:  Left Wound Description (Comments): L plantar foot wound Present on Admission: Yes   Wound Image Images linked: 1    Dressing Type Gauze (Comment);Moist to dry    Dressing Status Old drainage    Dressing Change Frequency PRN    Site / Wound Assessment Yellow;Red    Peri-wound Assessment Other (Comment)   callous perimeter   Margins Epibole (rolled edges)    Drainage Amount Moderate    Drainage Description Serosanguineous    Treatment Cleansed;Debridement (Selective)    Selective Debridement (non-excisional) - Location L foot wound and periwound    Selective Debridement (non-excisional) - Tools Used Forceps;Scalpel    Selective Debridement (non-excisional) - Tissue Removed callus, devitalized tissue, epibole    Wound Therapy - Clinical Statement see below    Wound Therapy - Functional Problem List ambulation    Factors Delaying/Impairing Wound Healing Diabetes Mellitus;Altered sensation;Infection - systemic/local;Immobility;Multiple medical problems;Tobacco use    Hydrotherapy Plan Dressing change;Debridement;Electrical stimulation;Patient/family education;Pulsatile lavage with suction;Ultrasonic wound therapy @35  KHz (+/- 3 KHz)    Wound Therapy - Frequency 2X / week    Wound Therapy - Current Recommendations PT    Wound Plan debride and dressing changes with dressing to promote correct healing environment.    Dressing  silver hydrofiber in woundbed, vaseline applied to perimeter, 4X4 and ABD, kerlix and #5 netting  PATIENT EDUCATION: Education details: Educated on dressing changes if soiled/drains through, POC, scope of PT Person educated: Patient and Spouse Education method: Explanation Education comprehension: verbalized understanding   HOME EXERCISE PROGRAM: N/a   GOALS: Goals reviewed with patient? Yes  SHORT TERM GOALS: Target date: 04/18/2023    Patient wound to decrease in size by 50% to demonstrate wound healing. Baseline: Goal status:  INITIAL   LONG TERM GOALS: Target date: 05/16/2023    Patients wound be healed to reduce risk of infection. Baseline:  Goal status: INITIAL  2.  Patient will verbalize proper skin care techniques in order to reduce risk of further wound development.  Baseline:  Goal status: INITIAL    ASSESSMENT:  CLINICAL IMPRESSION: Noted increase in drainage this session.  Increased maceration wound perimeter and some green drainage on contact layer of dressing, however no odor present or signs of infection.  Cleansed wound well and debrided devitalized tissue perimeter as well as slough from edges.  Changed dressing to silver hydrofiber to help absorb drainage and reduce bacterial count.  Wound photographed this session and measured at last visit.  Overall improving, however slowly approximating due to thick wound borders.  ABD pad placed over for drainage as well as coban and #5 netting.  Instructed spouse to change outer bandage if drainage seeps through.     OBJECTIVE IMPAIRMENTS: Abnormal gait, decreased activity tolerance, decreased balance, decreased endurance, decreased mobility, difficulty walking, decreased ROM, decreased strength, increased edema, impaired flexibility, impaired sensation, improper body mechanics, and pain.   ACTIVITY LIMITATIONS: carrying, lifting, bending, standing, stairs, transfers, bathing, dressing, hygiene/grooming, locomotion level, and caring for others  PARTICIPATION LIMITATIONS: meal prep, cleaning, laundry, shopping, community activity, occupation, and yard work  PERSONAL FACTORS: Fitness, Time since onset of injury/illness/exacerbation, and 3+ comorbidities: DM, smokes, increased BMI, HTN  are also affecting patient's functional outcome.   REHAB POTENTIAL: Fair    CLINICAL DECISION MAKING: Unstable/unpredictable  EVALUATION COMPLEXITY: High  PLAN: PT FREQUENCY: 2x/week  PT DURATION: 8 weeks  PLANNED INTERVENTIONS: Therapeutic exercises, Therapeutic  activity, Neuromuscular re-education, Balance training, Gait training, Patient/Family education, Joint manipulation, Joint mobilization, Stair training, Orthotic/Fit training, DME instructions, Aquatic Therapy, Dry Needling, Electrical stimulation, Spinal manipulation, Spinal mobilization, Cryotherapy, Moist heat, Compression bandaging, scar mobilization, Splintting, Taping, Traction, Ultrasound, Ionotophoresis 4mg /ml Dexamethasone, and Manual therapy   PLAN FOR NEXT SESSION: debride and dressing changes PRN.  Measure and photograph weekly.  Lurena Nida, PTA/CLT [facility] Ph: [phone]   Lurena Nida, PTA 04/06/2023, 9:47 AM

## 2023-04-10 ENCOUNTER — Ambulatory Visit (HOSPITAL_COMMUNITY): Payer: Medicaid Other | Admitting: Physical Therapy

## 2023-04-10 DIAGNOSIS — L02612 Cutaneous abscess of left foot: Secondary | ICD-10-CM

## 2023-04-10 DIAGNOSIS — R2689 Other abnormalities of gait and mobility: Secondary | ICD-10-CM | POA: Diagnosis not present

## 2023-04-10 NOTE — Therapy (Signed)
OUTPATIENT PHYSICAL THERAPY Wound Treatment   Patient Name: Wyatt Taylor MRN: [id number] DOB:1966-10-03, 57 y.o., male Today's Date: 04/10/2023   PCP: No PCP REFERRING PROVIDER: Alvina Filbert MD  END OF SESSION:  PT End of Session - 04/10/23 1432     Visit Number 7    Number of Visits 16    Date for PT Re-Evaluation 05/16/23    Authorization Type Medicaid [employer] community    Authorization Time Period no auth over 21, 30 visit limit    Authorization - Visit Number 7    Authorization - Number of Visits 30    Progress Note Due on Visit 10    PT Start Time 0820    PT Stop Time 0850    PT Time Calculation (min) 30 min    Activity Tolerance Patient tolerated treatment well    Behavior During Therapy WFL for tasks assessed/performed             Past Medical History:  Diagnosis Date   Chronic renal failure, stage 3a (HCC)    HTN (hypertension)    Type II diabetes mellitus with renal manifestations (HCC)    No past surgical history on file. Patient Active Problem List   Diagnosis Date Noted   Acute pulmonary embolism (HCC) 04/24/2022   HTN (hypertension) 04/24/2022   Hypertensive urgency 04/24/2022   Chronic renal failure, stage 3a (HCC) 04/24/2022   Type II diabetes mellitus with renal manifestations (HCC) 04/24/2022   Elevated troponin 04/24/2022    ONSET DATE: 02/14/23  REFERRING DIAG: E11.10 (ICD-10-CM) - Type 2 diabetes mellitus with ketoacidosis without coma L02.612 (ICD-10-CM) - Cutaneous abscess of left foot  THERAPY DIAG:  Other abnormalities of gait and mobility  Abscess of left foot  Rationale for Evaluation and Treatment: Rehabilitation     Wound Therapy - 04/10/23 1437     Subjective no pain or issues; changed it over the weekend due to drainage/odor per spouse    Patient and Family Stated Goals wound to heal    Date of Onset 02/14/23    Prior Treatments antibiotics dressing changes    Evaluation and Treatment Procedures Explained to  Patient/Family Yes    Evaluation and Treatment Procedures agreed to    Wound Properties Date First Assessed: 03/21/23 Time First Assessed: 0740 Wound Type: Diabetic ulcer Location: Heel Location Orientation: Left Wound Description (Comments): L plantar foot wound Present on Admission: Yes   Dressing Type Gauze (Comment);Moist to dry    Dressing Changed Changed    Dressing Status Old drainage    Dressing Change Frequency PRN    Site / Wound Assessment Yellow;Red    % Wound base Red or Granulating 90%    % Wound base Yellow/Fibrinous Exudate 10%    Peri-wound Assessment Other (Comment)   callous perimeter   Margins Epibole (rolled edges)    Drainage Amount Minimal    Drainage Description Serosanguineous;Green    Treatment Cleansed;Debridement (Selective)    Selective Debridement (non-excisional) - Location L foot wound and periwound    Selective Debridement (non-excisional) - Tools Used Forceps;Scalpel    Selective Debridement (non-excisional) - Tissue Removed callus, devitalized tissue, epibole    Wound Therapy - Clinical Statement see below    Wound Therapy - Functional Problem List ambulation    Factors Delaying/Impairing Wound Healing Diabetes Mellitus;Altered sensation;Infection - systemic/local;Immobility;Multiple medical problems;Tobacco use    Hydrotherapy Plan Dressing change;Debridement;Electrical stimulation;Patient/family education;Pulsatile lavage with suction;Ultrasonic wound therapy @35  KHz (+/- 3 KHz)    Wound Therapy - Frequency  2X / week    Wound Therapy - Current Recommendations PT    Wound Plan debride and dressing changes with dressing to promote correct healing environment.    Dressing  silver hydrofiber in woundbed, vaseline applied to perimeter, 4X4 and ABD, kerlix and #5 netting                 PATIENT EDUCATION: Education details: Educated on dressing changes if soiled/drains through, POC, scope of PT Person educated: Patient and Spouse Education  method: Explanation Education comprehension: verbalized understanding   HOME EXERCISE PROGRAM: N/a   GOALS: Goals reviewed with patient? Yes  SHORT TERM GOALS: Target date: 04/18/2023    Patient wound to decrease in size by 50% to demonstrate wound healing. Baseline: Goal status: INITIAL   LONG TERM GOALS: Target date: 05/16/2023    Patients wound be healed to reduce risk of infection. Baseline:  Goal status: INITIAL  2.  Patient will verbalize proper skin care techniques in order to reduce risk of further wound development.  Baseline:  Goal status: INITIAL    ASSESSMENT:  CLINICAL IMPRESSION: Wound with minimal change in size this session, however does still present with pseudomonas drainage (scant but present).  Wife reports some odor when changed over weekend, however therpapist did not detect any odor.  Suggested to call MD office and request antibiotic just to be cautious.  Spouse verbalized understanding.  Continued with cleansing of woundbed and debridement of perimeter. Remains free of most devitalized tissue inside wound with scattered areas around that remain adherent.  Continued with silver hydrofiber as this to help absorb drainage and reduce bacterial count.  ABD pad placed over for drainage as well as coban and #5 netting.  Instructed spouse to change outer bandage if drainage seeps through.     OBJECTIVE IMPAIRMENTS: Abnormal gait, decreased activity tolerance, decreased balance, decreased endurance, decreased mobility, difficulty walking, decreased ROM, decreased strength, increased edema, impaired flexibility, impaired sensation, improper body mechanics, and pain.   ACTIVITY LIMITATIONS: carrying, lifting, bending, standing, stairs, transfers, bathing, dressing, hygiene/grooming, locomotion level, and caring for others  PARTICIPATION LIMITATIONS: meal prep, cleaning, laundry, shopping, community activity, occupation, and yard work  PERSONAL FACTORS: Fitness,  Time since onset of injury/illness/exacerbation, and 3+ comorbidities: DM, smokes, increased BMI, HTN  are also affecting patient's functional outcome.   REHAB POTENTIAL: Fair    CLINICAL DECISION MAKING: Unstable/unpredictable  EVALUATION COMPLEXITY: High  PLAN: PT FREQUENCY: 2x/week  PT DURATION: 8 weeks  PLANNED INTERVENTIONS: Therapeutic exercises, Therapeutic activity, Neuromuscular re-education, Balance training, Gait training, Patient/Family education, Joint manipulation, Joint mobilization, Stair training, Orthotic/Fit training, DME instructions, Aquatic Therapy, Dry Needling, Electrical stimulation, Spinal manipulation, Spinal mobilization, Cryotherapy, Moist heat, Compression bandaging, scar mobilization, Splintting, Taping, Traction, Ultrasound, Ionotophoresis 4mg /ml Dexamethasone, and Manual therapy   PLAN FOR NEXT SESSION: debride and dressing changes PRN.  Measure and photograph weekly (Wednesdays)  Lurena Nida, PTA/CLT [facility] Ph: [phone]   Lurena Nida, PTA 04/10/2023, 2:39 PM

## 2023-04-11 ENCOUNTER — Ambulatory Visit (HOSPITAL_COMMUNITY): Payer: Medicaid Other | Admitting: Physical Therapy

## 2023-04-12 ENCOUNTER — Ambulatory Visit (HOSPITAL_COMMUNITY): Payer: Medicaid Other | Admitting: Physical Therapy

## 2023-04-12 DIAGNOSIS — R2689 Other abnormalities of gait and mobility: Secondary | ICD-10-CM

## 2023-04-12 DIAGNOSIS — L02612 Cutaneous abscess of left foot: Secondary | ICD-10-CM

## 2023-04-12 NOTE — Therapy (Signed)
OUTPATIENT PHYSICAL THERAPY Wound Treatment   Patient Name: Wyatt Taylor MRN: [id number] DOB:[birth year], 57 y.o., male Today's Date: 04/12/2023   PCP: No PCP REFERRING PROVIDER: Alvina Filbert MD  END OF SESSION:  PT End of Session - 04/12/23 1318     Visit Number 8    Number of Visits 16    Date for PT Re-Evaluation 05/16/23    Authorization Type Medicaid [employer] community    Authorization Time Period no auth over 21, 30 visit limit    Authorization - Visit Number 8    Authorization - Number of Visits 30    Progress Note Due on Visit 10    PT Start Time 1030    PT Stop Time 1100    PT Time Calculation (min) 30 min    Activity Tolerance Patient tolerated treatment well    Behavior During Therapy WFL for tasks assessed/performed             Past Medical History:  Diagnosis Date   Chronic renal failure, stage 3a (HCC)    HTN (hypertension)    Type II diabetes mellitus with renal manifestations (HCC)    No past surgical history on file. Patient Active Problem List   Diagnosis Date Noted   Acute pulmonary embolism (HCC) 04/24/2022   HTN (hypertension) 04/24/2022   Hypertensive urgency 04/24/2022   Chronic renal failure, stage 3a (HCC) 04/24/2022   Type II diabetes mellitus with renal manifestations (HCC) 04/24/2022   Elevated troponin 04/24/2022    ONSET DATE: 02/14/23  REFERRING DIAG: E11.10 (ICD-10-CM) - Type 2 diabetes mellitus with ketoacidosis without coma L02.612 (ICD-10-CM) - Cutaneous abscess of left foot  THERAPY DIAG:  Other abnormalities of gait and mobility  Abscess of left foot  Rationale for Evaluation and Treatment: Rehabilitation     Wound Therapy - 04/12/23 1320     Subjective states they have called about antibiotic but not heard back.  no pain or issues    Patient and Family Stated Goals wound to heal    Date of Onset 02/14/23    Prior Treatments antibiotics dressing changes    Evaluation and Treatment Procedures Explained to  Patient/Family Yes    Evaluation and Treatment Procedures agreed to    Wound Properties Date First Assessed: 03/21/23 Time First Assessed: 0740 Wound Type: Diabetic ulcer Location: Heel Location Orientation: Left Wound Description (Comments): L plantar foot wound Present on Admission: Yes   Wound Image Images linked: 1    Dressing Type Gauze (Comment);Moist to dry    Dressing Changed Changed    Dressing Status Old drainage    Dressing Change Frequency PRN    Site / Wound Assessment Yellow;Red    % Wound base Red or Granulating 90%    % Wound base Yellow/Fibrinous Exudate 10%    Peri-wound Assessment Other (Comment)   callous perimeter   Wound Length (cm) 12.8 cm    Wound Width (cm) 1.4 cm    Wound Depth (cm) 1.3 cm    Wound Volume (cm^3) 23.3 cm^3    Wound Surface Area (cm^2) 17.92 cm^2    Undermining (cm) distal end, lateral border approx 3 cm length undermined 1.2cm    Margins Other (Comment)   Thick borders, calloused   Drainage Amount Minimal    Drainage Description Serosanguineous    Treatment Cleansed;Debridement (Selective)    Selective Debridement (non-excisional) - Location L foot wound and periwound    Selective Debridement (non-excisional) - Tools Used Forceps;Scalpel    Selective Debridement (non-excisional) -  Tissue Removed callus, devitalized tissue, epibole    Wound Therapy - Clinical Statement see below    Wound Therapy - Functional Problem List ambulation    Factors Delaying/Impairing Wound Healing Diabetes Mellitus;Altered sensation;Infection - systemic/local;Immobility;Multiple medical problems;Tobacco use    Hydrotherapy Plan Dressing change;Debridement;Electrical stimulation;Patient/family education;Pulsatile lavage with suction;Ultrasonic wound therapy @35  KHz (+/- 3 KHz)    Wound Therapy - Frequency 2X / week    Wound Therapy - Current Recommendations PT    Wound Plan debride and dressing changes with dressing to promote correct healing environment.    Dressing   silver hydrofiber in woundbed, vaseline applied to perimeter, 4X4 and ABD, kerlix and #5 netting                  PATIENT EDUCATION: Education details: Educated on dressing changes if soiled/drains through, POC, scope of PT Person educated: Patient and Spouse Education method: Explanation Education comprehension: verbalized understanding   HOME EXERCISE PROGRAM: N/a   GOALS: Goals reviewed with patient? Yes  SHORT TERM GOALS: Target date: 04/18/2023    Patient wound to decrease in size by 50% to demonstrate wound healing. Baseline: Goal status: INITIAL   LONG TERM GOALS: Target date: 05/16/2023    Patients wound be healed to reduce risk of infection. Baseline:  Goal status: INITIAL  2.  Patient will verbalize proper skin care techniques in order to reduce risk of further wound development.  Baseline:  Goal status: INITIAL    ASSESSMENT:  CLINICAL IMPRESSION: Wound  measured and photographed this session.  Reducing in length with slight increase in width.  Thick skin perimeter is making it challenging to approximate.  Continued to shave edges with scapel to reduce devitalized tissue.  Noted pocket in distal end along lateral border with 1.3cm of undermining present. Suggested pt wash liner in boot as it appears soiled with drainage.  Pt and fiance verbalized understanding.  Continued with cleansing of woundbed and debridement of perimeter. Continued with silver hydrofiber as this to help absorb drainage and reduce bacterial count.  ABD pad placed over for drainage, kerlix and #5 netting.  Instructed fiance to change outer bandage if drainage seeps through.     OBJECTIVE IMPAIRMENTS: Abnormal gait, decreased activity tolerance, decreased balance, decreased endurance, decreased mobility, difficulty walking, decreased ROM, decreased strength, increased edema, impaired flexibility, impaired sensation, improper body mechanics, and pain.   ACTIVITY LIMITATIONS: carrying,  lifting, bending, standing, stairs, transfers, bathing, dressing, hygiene/grooming, locomotion level, and caring for others  PARTICIPATION LIMITATIONS: meal prep, cleaning, laundry, shopping, community activity, occupation, and yard work  PERSONAL FACTORS: Fitness, Time since onset of injury/illness/exacerbation, and 3+ comorbidities: DM, smokes, increased BMI, HTN  are also affecting patient's functional outcome.   REHAB POTENTIAL: Fair    CLINICAL DECISION MAKING: Unstable/unpredictable  EVALUATION COMPLEXITY: High  PLAN: PT FREQUENCY: 2x/week  PT DURATION: 8 weeks  PLANNED INTERVENTIONS: Therapeutic exercises, Therapeutic activity, Neuromuscular re-education, Balance training, Gait training, Patient/Family education, Joint manipulation, Joint mobilization, Stair training, Orthotic/Fit training, DME instructions, Aquatic Therapy, Dry Needling, Electrical stimulation, Spinal manipulation, Spinal mobilization, Cryotherapy, Moist heat, Compression bandaging, scar mobilization, Splintting, Taping, Traction, Ultrasound, Ionotophoresis 4mg /ml Dexamethasone, and Manual therapy   PLAN FOR NEXT SESSION: debride and dressing changes PRN.  Measure and photograph weekly (Wednesdays)  Lurena Nida, PTA/CLT [facility] Ph: [phone]   Lurena Nida, PTA 04/12/2023, 1:41 PM

## 2023-04-18 ENCOUNTER — Encounter (HOSPITAL_COMMUNITY): Payer: Self-pay | Admitting: Physical Therapy

## 2023-04-18 ENCOUNTER — Ambulatory Visit (HOSPITAL_COMMUNITY): Payer: Medicaid Other | Admitting: Physical Therapy

## 2023-04-18 DIAGNOSIS — R2689 Other abnormalities of gait and mobility: Secondary | ICD-10-CM

## 2023-04-18 DIAGNOSIS — L02612 Cutaneous abscess of left foot: Secondary | ICD-10-CM

## 2023-04-18 NOTE — Therapy (Signed)
OUTPATIENT PHYSICAL THERAPY Wound Treatment   Patient Name: Wyatt Taylor MRN: [id number] DOB:1966/01/18, 57 y.o., male Today's Date: 04/18/2023   PCP: No PCP REFERRING PROVIDER: Alvina Filbert MD  END OF SESSION:  PT End of Session - 04/18/23 0852     Visit Number 9    Number of Visits 16    Date for PT Re-Evaluation 05/16/23    Authorization Type Medicaid [employer] community    Authorization Time Period no auth over 21, 30 visit limit    Authorization - Visit Number 9    Authorization - Number of Visits 30    Progress Note Due on Visit 10    PT Start Time 0900    PT Stop Time 0938    PT Time Calculation (min) 38 min    Activity Tolerance Patient tolerated treatment well    Behavior During Therapy WFL for tasks assessed/performed             Past Medical History:  Diagnosis Date   Chronic renal failure, stage 3a (HCC)    HTN (hypertension)    Type II diabetes mellitus with renal manifestations (HCC)    History reviewed. No pertinent surgical history. Patient Active Problem List   Diagnosis Date Noted   Acute pulmonary embolism (HCC) 04/24/2022   HTN (hypertension) 04/24/2022   Hypertensive urgency 04/24/2022   Chronic renal failure, stage 3a (HCC) 04/24/2022   Type II diabetes mellitus with renal manifestations (HCC) 04/24/2022   Elevated troponin 04/24/2022    ONSET DATE: 02/14/23  REFERRING DIAG: E11.10 (ICD-10-CM) - Type 2 diabetes mellitus with ketoacidosis without coma L02.612 (ICD-10-CM) - Cutaneous abscess of left foot  THERAPY DIAG:  Other abnormalities of gait and mobility  Abscess of left foot  Rationale for Evaluation and Treatment: Rehabilitation     Wound Therapy - 04/18/23 0001     Subjective States he has been taking abx, will f/u with MD later this week.    Patient and Family Stated Goals wound to heal    Date of Onset 02/14/23    Prior Treatments antibiotics dressing changes    Evaluation and Treatment Procedures Explained to  Patient/Family Yes    Evaluation and Treatment Procedures agreed to    Wound Properties Date First Assessed: 03/21/23 Time First Assessed: 0740 Wound Type: Diabetic ulcer Location: Heel Location Orientation: Left Wound Description (Comments): L plantar foot wound Present on Admission: Yes   Dressing Type Gauze (Comment);Moist to dry    Dressing Changed Changed    Dressing Status Old drainage    Dressing Change Frequency PRN    Site / Wound Assessment Yellow;Red    % Wound base Red or Granulating 95%    % Wound base Yellow/Fibrinous Exudate 5%    Peri-wound Assessment Other (Comment)   callous perimeter   Margins Other (Comment)   Thick borders, calloused   Drainage Amount Minimal    Drainage Description Serosanguineous    Treatment Cleansed;Debridement (Selective)    Selective Debridement (non-excisional) - Location L foot wound and periwound    Selective Debridement (non-excisional) - Tools Used Forceps;Scalpel    Selective Debridement (non-excisional) - Tissue Removed callus, devitalized tissue, epibole    Wound Therapy - Clinical Statement see below    Wound Therapy - Functional Problem List ambulation    Factors Delaying/Impairing Wound Healing Diabetes Mellitus;Altered sensation;Infection - systemic/local;Immobility;Multiple medical problems;Tobacco use    Hydrotherapy Plan Dressing change;Debridement;Electrical stimulation;Patient/family education;Pulsatile lavage with suction;Ultrasonic wound therapy @35  KHz (+/- 3 KHz)    Wound Therapy -  Frequency 2X / week    Wound Therapy - Current Recommendations PT    Wound Plan debride and dressing changes with dressing to promote correct healing environment.    Dressing  xeroform in woundbed, vaseline applied to perimeter, 4X4 and ABD, kerlix and #5 netting                  PATIENT EDUCATION: Education details: Educated on dressing changes if soiled/drains through, POC, scope of PT Person educated: Patient and Spouse Education  method: Explanation Education comprehension: verbalized understanding   HOME EXERCISE PROGRAM: N/a   GOALS: Goals reviewed with patient? Yes  SHORT TERM GOALS: Target date: 04/18/2023    Patient wound to decrease in size by 50% to demonstrate wound healing. Baseline: Goal status: INITIAL   LONG TERM GOALS: Target date: 05/16/2023    Patients wound be healed to reduce risk of infection. Baseline:  Goal status: INITIAL  2.  Patient will verbalize proper skin care techniques in order to reduce risk of further wound development.  Baseline:  Goal status: INITIAL    ASSESSMENT:  CLINICAL IMPRESSION: Patients spouse had to change dressing since last session due to drainage. Dressing dry upon removal. Wound with 50-75 percent slough upon removal of dressing. Able to debride majority of slough, epibole edges, and callous/dry skin from periwound. Patient with good granulation tissue below. Changed dressing to xeroform in wound bed. Overall, progressing well. Patient will continue to benefit from PT to promote wound healing.   OBJECTIVE IMPAIRMENTS: Abnormal gait, decreased activity tolerance, decreased balance, decreased endurance, decreased mobility, difficulty walking, decreased ROM, decreased strength, increased edema, impaired flexibility, impaired sensation, improper body mechanics, and pain.   ACTIVITY LIMITATIONS: carrying, lifting, bending, standing, stairs, transfers, bathing, dressing, hygiene/grooming, locomotion level, and caring for others  PARTICIPATION LIMITATIONS: meal prep, cleaning, laundry, shopping, community activity, occupation, and yard work  PERSONAL FACTORS: Fitness, Time since onset of injury/illness/exacerbation, and 3+ comorbidities: DM, smokes, increased BMI, HTN  are also affecting patient's functional outcome.   REHAB POTENTIAL: Fair    CLINICAL DECISION MAKING: Unstable/unpredictable  EVALUATION COMPLEXITY: High  PLAN: PT FREQUENCY:  2x/week  PT DURATION: 8 weeks  PLANNED INTERVENTIONS: Therapeutic exercises, Therapeutic activity, Neuromuscular re-education, Balance training, Gait training, Patient/Family education, Joint manipulation, Joint mobilization, Stair training, Orthotic/Fit training, DME instructions, Aquatic Therapy, Dry Needling, Electrical stimulation, Spinal manipulation, Spinal mobilization, Cryotherapy, Moist heat, Compression bandaging, scar mobilization, Splintting, Taping, Traction, Ultrasound, Ionotophoresis 4mg /ml Dexamethasone, and Manual therapy   PLAN FOR NEXT SESSION: debride and dressing changes PRN.  Measure and photograph weekly (Wednesdays)    Reola Mosher Phynix Horton, PT 04/18/2023, 9:43 AM

## 2023-04-20 ENCOUNTER — Ambulatory Visit (HOSPITAL_COMMUNITY): Payer: Medicaid Other | Admitting: Physical Therapy

## 2023-04-24 ENCOUNTER — Encounter (HOSPITAL_COMMUNITY): Payer: Self-pay | Admitting: Physical Therapy

## 2023-04-24 ENCOUNTER — Ambulatory Visit (HOSPITAL_COMMUNITY): Payer: Medicaid Other | Attending: Internal Medicine | Admitting: Physical Therapy

## 2023-04-24 DIAGNOSIS — L02612 Cutaneous abscess of left foot: Secondary | ICD-10-CM | POA: Diagnosis present

## 2023-04-24 DIAGNOSIS — R2689 Other abnormalities of gait and mobility: Secondary | ICD-10-CM

## 2023-04-24 NOTE — Therapy (Signed)
OUTPATIENT PHYSICAL THERAPY Wound Treatment   Patient Name: Wyatt Taylor MRN: [id number] DOB:Feb 11, 1966, 57 y.o., male Today's Date: 04/24/2023  Progress Note   Reporting Period 03/21/23 to 04/24/23   See note below for Objective Data and Assessment of Progress/Goals   PCP: No PCP REFERRING PROVIDER: Alvina Filbert MD  END OF SESSION:  PT End of Session - 04/24/23 0807     Visit Number 10    Number of Visits 16    Date for PT Re-Evaluation 05/16/23    Authorization Type Medicaid [employer] community    Authorization Time Period no auth over 21, 30 visit limit    Authorization - Visit Number 10    Authorization - Number of Visits 30    Progress Note Due on Visit 20    PT Start Time 0815    PT Stop Time 0850    PT Time Calculation (min) 35 min    Activity Tolerance Patient tolerated treatment well    Behavior During Therapy WFL for tasks assessed/performed             Past Medical History:  Diagnosis Date   Chronic renal failure, stage 3a (HCC)    HTN (hypertension)    Type II diabetes mellitus with renal manifestations (HCC)    History reviewed. No pertinent surgical history. Patient Active Problem List   Diagnosis Date Noted   Acute pulmonary embolism (HCC) 04/24/2022   HTN (hypertension) 04/24/2022   Hypertensive urgency 04/24/2022   Chronic renal failure, stage 3a (HCC) 04/24/2022   Type II diabetes mellitus with renal manifestations (HCC) 04/24/2022   Elevated troponin 04/24/2022    ONSET DATE: 02/14/23  REFERRING DIAG: E11.10 (ICD-10-CM) - Type 2 diabetes mellitus with ketoacidosis without coma L02.612 (ICD-10-CM) - Cutaneous abscess of left foot  THERAPY DIAG:  Other abnormalities of gait and mobility  Abscess of left foot  Rationale for Evaluation and Treatment: Rehabilitation     Wound Therapy - 04/24/23 0001     Subjective States they change dressings PRN    Patient and Family Stated Goals wound to heal    Date of Onset 02/14/23    Prior  Treatments antibiotics dressing changes    Evaluation and Treatment Procedures Explained to Patient/Family Yes    Evaluation and Treatment Procedures agreed to    Wound Properties Date First Assessed: 03/21/23 Time First Assessed: 0740 Wound Type: Diabetic ulcer Location: Heel Location Orientation: Left Wound Description (Comments): L plantar foot wound Present on Admission: Yes   Dressing Type Gauze (Comment);Moist to dry    Dressing Changed Changed    Dressing Status Old drainage    Dressing Change Frequency PRN    Site / Wound Assessment Yellow;Red    % Wound base Red or Granulating 95%    % Wound base Yellow/Fibrinous Exudate 5%    Peri-wound Assessment Other (Comment)   callous perimeter   Margins Epibole (rolled edges)   Thick borders, calloused   Drainage Amount Minimal    Drainage Description Serosanguineous    Treatment Cleansed;Debridement (Selective)    Selective Debridement (non-excisional) - Location L foot wound and periwound    Selective Debridement (non-excisional) - Tools Used Scissors;Forceps    Selective Debridement (non-excisional) - Tissue Removed callus, devitalized tissue, epibole    Wound Therapy - Clinical Statement see below    Wound Therapy - Functional Problem List ambulation    Factors Delaying/Impairing Wound Healing Diabetes Mellitus;Altered sensation;Infection - systemic/local;Immobility;Multiple medical problems;Tobacco use    Hydrotherapy Plan Dressing change;Debridement;Electrical stimulation;Patient/family education;Pulsatile  lavage with suction;Ultrasonic wound therapy @35  KHz (+/- 3 KHz)    Wound Therapy - Frequency 2X / week    Wound Therapy - Current Recommendations PT    Wound Plan debride and dressing changes with dressing to promote correct healing environment.    Dressing  xeroform in woundbed, vaseline applied to perimeter, 4X4 and ABD, kerlix and #5 netting                  PATIENT EDUCATION: Education details: Educated on dressing  changes if soiled/drains through, POC, scope of PT Person educated: Patient and Spouse Education method: Explanation Education comprehension: verbalized understanding   HOME EXERCISE PROGRAM: N/a   GOALS: Goals reviewed with patient? Yes  SHORT TERM GOALS: Target date: 04/18/2023    Patient wound to decrease in size by 50% to demonstrate wound healing. Baseline: Goal status: INITIAL   LONG TERM GOALS: Target date: 05/16/2023    Patients wound be healed to reduce risk of infection. Baseline:  Goal status: INITIAL  2.  Patient will verbalize proper skin care techniques in order to reduce risk of further wound development.  Baseline:  Goal status: INITIAL    ASSESSMENT:  CLINICAL IMPRESSION: Wound healing well with continued depth decreasing and granulation but some slough present at beginning of session. There are thick callus margins and epibole. Able to debride majority of slough, epibole edges, and callous/dry skin from periwound. Patient with good granulation tissue below. Continued with dressing to xeroform in wound bed. Patient has not met goals at this time due to continued wound size and wound being present but wound has decreased in size since evaluation. Overall, progressing well. Patient will continue to benefit from PT to promote wound healing.   OBJECTIVE IMPAIRMENTS: Abnormal gait, decreased activity tolerance, decreased balance, decreased endurance, decreased mobility, difficulty walking, decreased ROM, decreased strength, increased edema, impaired flexibility, impaired sensation, improper body mechanics, and pain.   ACTIVITY LIMITATIONS: carrying, lifting, bending, standing, stairs, transfers, bathing, dressing, hygiene/grooming, locomotion level, and caring for others  PARTICIPATION LIMITATIONS: meal prep, cleaning, laundry, shopping, community activity, occupation, and yard work  PERSONAL FACTORS: Fitness, Time since onset of injury/illness/exacerbation, and  3+ comorbidities: DM, smokes, increased BMI, HTN  are also affecting patient's functional outcome.   REHAB POTENTIAL: Fair    CLINICAL DECISION MAKING: Unstable/unpredictable  EVALUATION COMPLEXITY: High  PLAN: PT FREQUENCY: 2x/week  PT DURATION: 8 weeks  PLANNED INTERVENTIONS: Therapeutic exercises, Therapeutic activity, Neuromuscular re-education, Balance training, Gait training, Patient/Family education, Joint manipulation, Joint mobilization, Stair training, Orthotic/Fit training, DME instructions, Aquatic Therapy, Dry Needling, Electrical stimulation, Spinal manipulation, Spinal mobilization, Cryotherapy, Moist heat, Compression bandaging, scar mobilization, Splintting, Taping, Traction, Ultrasound, Ionotophoresis 4mg /ml Dexamethasone, and Manual therapy   PLAN FOR NEXT SESSION: debride and dressing changes PRN.  Measure and photograph weekly (Wednesdays)    Reola Mosher Earle Burson, PT 04/24/2023, 8:56 AM

## 2023-04-26 ENCOUNTER — Ambulatory Visit (HOSPITAL_COMMUNITY): Payer: Medicaid Other | Admitting: Physical Therapy

## 2023-04-26 DIAGNOSIS — R2689 Other abnormalities of gait and mobility: Secondary | ICD-10-CM | POA: Diagnosis not present

## 2023-04-26 DIAGNOSIS — L02612 Cutaneous abscess of left foot: Secondary | ICD-10-CM

## 2023-04-26 NOTE — Therapy (Signed)
OUTPATIENT PHYSICAL THERAPY Wound Treatment   Patient Name: Wyatt Taylor MRN: [id number] DOB:09-13-1966, 57 y.o., male Today's Date: 04/26/2023   PCP: No PCP REFERRING PROVIDER: Alvina Filbert MD  END OF SESSION:  PT End of Session - 04/26/23 1411     Visit Number 11    Number of Visits 16    Date for PT Re-Evaluation 05/16/23    Authorization Type Medicaid [employer] community    Authorization Time Period no auth over 21, 30 visit limit    Authorization - Number of Visits 30    Progress Note Due on Visit 20    PT Start Time 1120    PT Stop Time 1150    PT Time Calculation (min) 30 min    Activity Tolerance Patient tolerated treatment well    Behavior During Therapy WFL for tasks assessed/performed             Past Medical History:  Diagnosis Date   Chronic renal failure, stage 3a (HCC)    HTN (hypertension)    Type II diabetes mellitus with renal manifestations (HCC)    No past surgical history on file. Patient Active Problem List   Diagnosis Date Noted   Acute pulmonary embolism (HCC) 04/24/2022   HTN (hypertension) 04/24/2022   Hypertensive urgency 04/24/2022   Chronic renal failure, stage 3a (HCC) 04/24/2022   Type II diabetes mellitus with renal manifestations (HCC) 04/24/2022   Elevated troponin 04/24/2022    ONSET DATE: 02/14/23  REFERRING DIAG: E11.10 (ICD-10-CM) - Type 2 diabetes mellitus with ketoacidosis without coma L02.612 (ICD-10-CM) - Cutaneous abscess of left foot  THERAPY DIAG:  Other abnormalities of gait and mobility  Abscess of left foot  Rationale for Evaluation and Treatment: Rehabilitation     Wound Therapy - 04/26/23 1412     Subjective States they change dressings PRN    Patient and Family Stated Goals wound to heal    Date of Onset 02/14/23    Prior Treatments antibiotics dressing changes    Evaluation and Treatment Procedures Explained to Patient/Family Yes    Evaluation and Treatment Procedures agreed to    Wound Properties  Date First Assessed: 03/21/23 Time First Assessed: 0740 Wound Type: Diabetic ulcer Location: Heel Location Orientation: Left Wound Description (Comments): L plantar foot wound Present on Admission: Yes   Dressing Type Gauze (Comment);Moist to dry    Dressing Status Old drainage    Dressing Change Frequency PRN    Site / Wound Assessment Yellow;Red    Peri-wound Assessment Other (Comment)   callous perimeter   Wound Length (cm) 12 cm   was 12.8 cm   Wound Width (cm) 1 cm   was 1.4 cm   Wound Depth (cm) 1 cm   was 1.3 cm   Wound Volume (cm^3) 12 cm^3    Wound Surface Area (cm^2) 12 cm^2    Margins Epibole (rolled edges)   Thick borders, calloused   Drainage Amount Minimal    Drainage Description Serosanguineous    Treatment Cleansed;Debridement (Selective)    Selective Debridement (non-excisional) - Location L foot wound and periwound    Selective Debridement (non-excisional) - Tools Used Scissors;Forceps    Selective Debridement (non-excisional) - Tissue Removed callus, devitalized tissue, epibole    Wound Therapy - Clinical Statement see below    Wound Therapy - Functional Problem List ambulation    Factors Delaying/Impairing Wound Healing Diabetes Mellitus;Altered sensation;Infection - systemic/local;Immobility;Multiple medical problems;Tobacco use    Hydrotherapy Plan Dressing change;Debridement;Electrical stimulation;Patient/family education;Pulsatile lavage with  suction;Ultrasonic wound therapy @35  KHz (+/- 3 KHz)    Wound Therapy - Frequency 2X / week    Wound Therapy - Current Recommendations PT    Wound Plan debride and dressing changes with dressing to promote correct healing environment.    Dressing  xeroform in woundbed, vaseline applied to perimeter, 4X4 and ABD, kerlix and #5 netting                  PATIENT EDUCATION: Education details: Educated on dressing changes if soiled/drains through, POC, scope of PT Person educated: Patient and Spouse Education method:  Explanation Education comprehension: verbalized understanding   HOME EXERCISE PROGRAM: N/a   GOALS: Goals reviewed with patient? Yes  SHORT TERM GOALS: Target date: 04/18/2023    Patient wound to decrease in size by 50% to demonstrate wound healing. Baseline: Goal status: INITIAL   LONG TERM GOALS: Target date: 05/16/2023    Patients wound be healed to reduce risk of infection. Baseline:  Goal status: INITIAL  2.  Patient will verbalize proper skin care techniques in order to reduce risk of further wound development.  Baseline:  Goal status: INITIAL    ASSESSMENT:  CLINICAL IMPRESSION: Wound measured this session with approx 0.3cm reduction in all directions since last week measurements.  Noted filling in of wound with approximation to wound borders.  Majority of margins continue to present with thick callus, however not as much.  Debridement needed to slough, epibole edges, and callous/dry skin from periwound. Xeroform appears to maintain wound environment. Patient will continue to benefit from PT to promote wound healing.   OBJECTIVE IMPAIRMENTS: Abnormal gait, decreased activity tolerance, decreased balance, decreased endurance, decreased mobility, difficulty walking, decreased ROM, decreased strength, increased edema, impaired flexibility, impaired sensation, improper body mechanics, and pain.   ACTIVITY LIMITATIONS: carrying, lifting, bending, standing, stairs, transfers, bathing, dressing, hygiene/grooming, locomotion level, and caring for others  PARTICIPATION LIMITATIONS: meal prep, cleaning, laundry, shopping, community activity, occupation, and yard work  PERSONAL FACTORS: Fitness, Time since onset of injury/illness/exacerbation, and 3+ comorbidities: DM, smokes, increased BMI, HTN  are also affecting patient's functional outcome.   REHAB POTENTIAL: Fair    CLINICAL DECISION MAKING: Unstable/unpredictable  EVALUATION COMPLEXITY: High  PLAN: PT FREQUENCY:  2x/week  PT DURATION: 8 weeks  PLANNED INTERVENTIONS: Therapeutic exercises, Therapeutic activity, Neuromuscular re-education, Balance training, Gait training, Patient/Family education, Joint manipulation, Joint mobilization, Stair training, Orthotic/Fit training, DME instructions, Aquatic Therapy, Dry Needling, Electrical stimulation, Spinal manipulation, Spinal mobilization, Cryotherapy, Moist heat, Compression bandaging, scar mobilization, Splintting, Taping, Traction, Ultrasound, Ionotophoresis 4mg /ml Dexamethasone, and Manual therapy   PLAN FOR NEXT SESSION: debride and dressing changes PRN.  Measure and photograph weekly (Wednesdays)    Bascom Levels, Lissie Hinesley B, PTA 04/26/2023, 2:14 PM

## 2023-05-01 ENCOUNTER — Ambulatory Visit (HOSPITAL_COMMUNITY): Payer: Medicaid Other | Admitting: Physical Therapy

## 2023-05-01 ENCOUNTER — Encounter (HOSPITAL_COMMUNITY): Payer: Self-pay | Admitting: Physical Therapy

## 2023-05-01 DIAGNOSIS — R2689 Other abnormalities of gait and mobility: Secondary | ICD-10-CM | POA: Diagnosis not present

## 2023-05-01 DIAGNOSIS — L02612 Cutaneous abscess of left foot: Secondary | ICD-10-CM

## 2023-05-01 NOTE — Therapy (Signed)
OUTPATIENT PHYSICAL THERAPY Wound Treatment   Patient Name: Wyatt Taylor MRN: [id number] DOB:1966-04-12, 57 y.o., male Today's Date: 05/01/2023   PCP: No PCP REFERRING PROVIDER: Alvina Filbert MD  END OF SESSION:  PT End of Session - 05/01/23 0813     Visit Number 12    Number of Visits 16    Date for PT Re-Evaluation 05/16/23    Authorization Type Medicaid [employer] community    Authorization Time Period no auth over 21, 30 visit limit    Authorization - Number of Visits 30    Progress Note Due on Visit 20    PT Start Time 0815    PT Stop Time 0900    PT Time Calculation (min) 45 min    Activity Tolerance Patient tolerated treatment well    Behavior During Therapy WFL for tasks assessed/performed             Past Medical History:  Diagnosis Date   Chronic renal failure, stage 3a (HCC)    HTN (hypertension)    Type II diabetes mellitus with renal manifestations (HCC)    History reviewed. No pertinent surgical history. Patient Active Problem List   Diagnosis Date Noted   Acute pulmonary embolism (HCC) 04/24/2022   HTN (hypertension) 04/24/2022   Hypertensive urgency 04/24/2022   Chronic renal failure, stage 3a (HCC) 04/24/2022   Type II diabetes mellitus with renal manifestations (HCC) 04/24/2022   Elevated troponin 04/24/2022    ONSET DATE: 02/14/23  REFERRING DIAG: E11.10 (ICD-10-CM) - Type 2 diabetes mellitus with ketoacidosis without coma L02.612 (ICD-10-CM) - Cutaneous abscess of left foot  THERAPY DIAG:  Other abnormalities of gait and mobility  Abscess of left foot  Rationale for Evaluation and Treatment: Rehabilitation     Wound Therapy - 05/01/23 0001     Subjective Feels its healing well    Patient and Family Stated Goals wound to heal    Date of Onset 02/14/23    Prior Treatments antibiotics dressing changes    Evaluation and Treatment Procedures Explained to Patient/Family Yes    Evaluation and Treatment Procedures agreed to    Wound  Properties Date First Assessed: 03/21/23 Time First Assessed: 0740 Wound Type: Diabetic ulcer Location: Heel Location Orientation: Left Wound Description (Comments): L plantar foot wound Present on Admission: Yes   Dressing Type Gauze (Comment);Moist to dry    Dressing Changed Changed    Dressing Status Old drainage    Dressing Change Frequency PRN    Site / Wound Assessment Yellow;Red    % Wound base Red or Granulating 95%    % Wound base Yellow/Fibrinous Exudate 5%    Peri-wound Assessment Other (Comment)   callous perimeter   Margins Epibole (rolled edges)   Thick borders, calloused   Drainage Amount Minimal    Drainage Description Serosanguineous    Treatment Cleansed;Debridement (Selective)    Selective Debridement (non-excisional) - Location L foot wound and periwound    Selective Debridement (non-excisional) - Tools Used Scissors;Forceps    Selective Debridement (non-excisional) - Tissue Removed callus, devitalized tissue, epibole    Wound Therapy - Clinical Statement see below    Wound Therapy - Functional Problem List ambulation    Factors Delaying/Impairing Wound Healing Diabetes Mellitus;Altered sensation;Infection - systemic/local;Immobility;Multiple medical problems;Tobacco use    Hydrotherapy Plan Dressing change;Debridement;Electrical stimulation;Patient/family education;Pulsatile lavage with suction;Ultrasonic wound therapy @35  KHz (+/- 3 KHz)    Wound Therapy - Frequency 2X / week    Wound Therapy - Current Recommendations PT  Wound Plan debride and dressing changes with dressing to promote correct healing environment.    Dressing  xeroform in woundbed, vaseline applied to perimeter, 4X4 and ABD, kerlix and #5 netting                  PATIENT EDUCATION: Education details: Educated on dressing changes if soiled/drains through, POC, scope of PT Person educated: Patient and Spouse Education method: Explanation Education comprehension: verbalized  understanding   HOME EXERCISE PROGRAM: N/a   GOALS: Goals reviewed with patient? Yes  SHORT TERM GOALS: Target date: 04/18/2023    Patient wound to decrease in size by 50% to demonstrate wound healing. Baseline: Goal status: INITIAL   LONG TERM GOALS: Target date: 05/16/2023    Patients wound be healed to reduce risk of infection. Baseline:  Goal status: INITIAL  2.  Patient will verbalize proper skin care techniques in order to reduce risk of further wound development.  Baseline:  Goal status: INITIAL    ASSESSMENT:  CLINICAL IMPRESSION: Patient wound appearing smaller than last week but proximally there is an area calloused over and upon debridement some continued open wound below. Continued to debride some slough from wound bed and ebibole margins. Also needed to remove some callous from margins with some continued callous remaining. Continued with xeroform in wound bed. Patient will continue to benefit from PT to promote wound healing.   OBJECTIVE IMPAIRMENTS: Abnormal gait, decreased activity tolerance, decreased balance, decreased endurance, decreased mobility, difficulty walking, decreased ROM, decreased strength, increased edema, impaired flexibility, impaired sensation, improper body mechanics, and pain.   ACTIVITY LIMITATIONS: carrying, lifting, bending, standing, stairs, transfers, bathing, dressing, hygiene/grooming, locomotion level, and caring for others  PARTICIPATION LIMITATIONS: meal prep, cleaning, laundry, shopping, community activity, occupation, and yard work  PERSONAL FACTORS: Fitness, Time since onset of injury/illness/exacerbation, and 3+ comorbidities: DM, smokes, increased BMI, HTN  are also affecting patient's functional outcome.   REHAB POTENTIAL: Fair    CLINICAL DECISION MAKING: Unstable/unpredictable  EVALUATION COMPLEXITY: High  PLAN: PT FREQUENCY: 2x/week  PT DURATION: 8 weeks  PLANNED INTERVENTIONS: Therapeutic exercises,  Therapeutic activity, Neuromuscular re-education, Balance training, Gait training, Patient/Family education, Joint manipulation, Joint mobilization, Stair training, Orthotic/Fit training, DME instructions, Aquatic Therapy, Dry Needling, Electrical stimulation, Spinal manipulation, Spinal mobilization, Cryotherapy, Moist heat, Compression bandaging, scar mobilization, Splintting, Taping, Traction, Ultrasound, Ionotophoresis 4mg /ml Dexamethasone, and Manual therapy   PLAN FOR NEXT SESSION: debride and dressing changes PRN.  Measure and photograph weekly (Wednesdays)    Reola Mosher Yazlynn Birkeland, PT 05/01/2023, 9:04 AM

## 2023-05-03 ENCOUNTER — Ambulatory Visit (HOSPITAL_COMMUNITY): Payer: Medicaid Other | Admitting: Physical Therapy

## 2023-05-03 DIAGNOSIS — R2689 Other abnormalities of gait and mobility: Secondary | ICD-10-CM

## 2023-05-03 DIAGNOSIS — L02612 Cutaneous abscess of left foot: Secondary | ICD-10-CM

## 2023-05-03 NOTE — Therapy (Addendum)
OUTPATIENT PHYSICAL THERAPY Wound Treatment   Patient Name: Wyatt Taylor MRN: [id number] DOB:Jan 20, 1966, 57 y.o., male Today's Date: 05/03/2023   PCP: No PCP REFERRING PROVIDER: Alvina Filbert MD  END OF SESSION:  PT End of Session - 05/03/23 1514     Visit Number 13    Number of Visits 16    Date for PT Re-Evaluation 05/16/23    Authorization Type Medicaid [employer] community    Authorization Time Period no auth over 21, 30 visit limit    Authorization - Number of Visits 30    Progress Note Due on Visit 20    PT Start Time 1430    PT Stop Time 1500    PT Time Calculation (min) 30 min    Activity Tolerance Patient tolerated treatment well    Behavior During Therapy WFL for tasks assessed/performed             Past Medical History:  Diagnosis Date   Chronic renal failure, stage 3a (HCC)    HTN (hypertension)    Type II diabetes mellitus with renal manifestations (HCC)    No past surgical history on file. Patient Active Problem List   Diagnosis Date Noted   Acute pulmonary embolism (HCC) 04/24/2022   HTN (hypertension) 04/24/2022   Hypertensive urgency 04/24/2022   Chronic renal failure, stage 3a (HCC) 04/24/2022   Type II diabetes mellitus with renal manifestations (HCC) 04/24/2022   Elevated troponin 04/24/2022    ONSET DATE: 02/14/23  REFERRING DIAG: E11.10 (ICD-10-CM) - Type 2 diabetes mellitus with ketoacidosis without coma L02.612 (ICD-10-CM) - Cutaneous abscess of left foot  THERAPY DIAG:  Other abnormalities of gait and mobility  Abscess of left foot  Rationale for Evaluation and Treatment: Rehabilitation  Wound Therapy - 05/03/23 1412       Subjective States they change dressings PRN     Patient and Family Stated Goals wound to heal     Date of Onset 02/14/23     Prior Treatments antibiotics dressing changes     Evaluation and Treatment Procedures Explained to Patient/Family Yes     Evaluation and Treatment Procedures agreed to     Wound  Properties Date First Assessed: 03/21/23 Time First Assessed: 0740 Wound Type: Diabetic ulcer Location: Heel Location Orientation: Left Wound Description (Comments): L plantar foot wound Present on Admission: Yes    Dressing Type Gauze (Comment);Moist to dry     Dressing Status Old drainage     Dressing Change Frequency PRN     Site / Wound Assessment Yellow;Red     Peri-wound Assessment Other (Comment)   callous perimeter    Wound Length (cm) 11.6 cm   was 12.8 cm    Wound Width (cm) 0.9 cm   was 1.4 cm    Wound Depth (cm) 0.9 cm   was 1.3 cm    Wound Volume (cm^3) 9.4     Wound Surface Area (cm^2) 10.44     Margins Epibole (rolled edges)   Thick borders, calloused    Drainage Amount Minimal     Drainage Description Serosanguineous     Treatment Cleansed;Debridement (Selective)     Selective Debridement (non-excisional) - Location L foot wound and periwound     Selective Debridement (non-excisional) - Tools Used Scissors;Forceps     Selective Debridement (non-excisional) - Tissue Removed callus, devitalized tissue, epibole     Wound Therapy - Clinical Statement see below     Wound Therapy - Functional Problem List ambulation  Factors Delaying/Impairing Wound Healing Diabetes Mellitus;Altered sensation;Infection - systemic/local;Immobility;Multiple medical problems;Tobacco use     Hydrotherapy Plan Dressing change;Debridement;Electrical stimulation;Patient/family education;Pulsatile lavage with suction;Ultrasonic wound therapy @35  KHz (+/- 3 KHz)     Wound Therapy - Frequency 2X / week     Wound Therapy - Current Recommendations PT     Wound Plan debride and dressing changes with dressing to promote correct healing environment.     Dressing  xeroform in woundbed, vaseline applied to perimeter, 4X4 and ABD, kerlix and #5 netting            PATIENT EDUCATION: Education details: Educated on dressing changes if soiled/drains through, POC, scope of PT Person educated: Patient and  Spouse Education method: Explanation Education comprehension: verbalized understanding   HOME EXERCISE PROGRAM: N/a   GOALS: Goals reviewed with patient? Yes  SHORT TERM GOALS: Target date: 04/18/2023    Patient wound to decrease in size by 50% to demonstrate wound healing. Baseline: Goal status: INITIAL   LONG TERM GOALS: Target date: 05/16/2023    Patients wound be healed to reduce risk of infection. Baseline:  Goal status: INITIAL  2.  Patient will verbalize proper skin care techniques in order to reduce risk of further wound development.  Baseline:  Goal status: INITIAL    ASSESSMENT:  CLINICAL IMPRESSION: Wound measured but did not have phone to photograph this session.  Wound is approximating well with mostly 0.2 cm depth except at most distal end where it is filling in but at deepest of 0.9cm. this area also has the thickest amount of callous perimeter making it difficult to approximate.   Debrided wound borders and cleansed well prior to reapplication of xeroform.  Did not apply outer coban today as he is returning to MD tomorrow.  Overall wound is improving and healing nicely. Patient will continue to benefit from PT to promote wound healing.   OBJECTIVE IMPAIRMENTS: Abnormal gait, decreased activity tolerance, decreased balance, decreased endurance, decreased mobility, difficulty walking, decreased ROM, decreased strength, increased edema, impaired flexibility, impaired sensation, improper body mechanics, and pain.   ACTIVITY LIMITATIONS: carrying, lifting, bending, standing, stairs, transfers, bathing, dressing, hygiene/grooming, locomotion level, and caring for others  PARTICIPATION LIMITATIONS: meal prep, cleaning, laundry, shopping, community activity, occupation, and yard work  PERSONAL FACTORS: Fitness, Time since onset of injury/illness/exacerbation, and 3+ comorbidities: DM, smokes, increased BMI, HTN  are also affecting patient's functional outcome.    REHAB POTENTIAL: Fair    CLINICAL DECISION MAKING: Unstable/unpredictable  EVALUATION COMPLEXITY: High  PLAN: PT FREQUENCY: 2x/week  PT DURATION: 8 weeks  PLANNED INTERVENTIONS: Therapeutic exercises, Therapeutic activity, Neuromuscular re-education, Balance training, Gait training, Patient/Family education, Joint manipulation, Joint mobilization, Stair training, Orthotic/Fit training, DME instructions, Aquatic Therapy, Dry Needling, Electrical stimulation, Spinal manipulation, Spinal mobilization, Cryotherapy, Moist heat, Compression bandaging, scar mobilization, Splintting, Taping, Traction, Ultrasound, Ionotophoresis 4mg /ml Dexamethasone, and Manual therapy   PLAN FOR NEXT SESSION: debride and dressing changes PRN.  Measure and photograph weekly (Wednesdays)   Lurena Nida, PTA/CLT [facility] Ph: [phone]  Lurena Nida, PTA 05/03/2023, 3:19 PM

## 2023-05-08 ENCOUNTER — Ambulatory Visit (HOSPITAL_COMMUNITY): Payer: Medicaid Other

## 2023-05-08 ENCOUNTER — Encounter (HOSPITAL_COMMUNITY): Payer: Self-pay

## 2023-05-08 DIAGNOSIS — R2689 Other abnormalities of gait and mobility: Secondary | ICD-10-CM

## 2023-05-08 NOTE — Therapy (Signed)
OUTPATIENT PHYSICAL THERAPY Wound Treatment   Patient Name: Wyatt Taylor MRN: [id number] DOB:09/09/1966, 57 y.o., male Today's Date: 05/08/2023   PCP: No PCP REFERRING PROVIDER: Alvina Filbert MD  END OF SESSION:  PT End of Session - 05/08/23 0955     Visit Number 14    Number of Visits 16    Date for PT Re-Evaluation 05/16/23    Authorization Type Medicaid [employer] community    Authorization Time Period no auth over 21, 30 visit limit    Authorization - Number of Visits 30    Progress Note Due on Visit 20    PT Start Time 0910    PT Stop Time 0945    PT Time Calculation (min) 35 min    Activity Tolerance Patient tolerated treatment well    Behavior During Therapy WFL for tasks assessed/performed             Past Medical History:  Diagnosis Date   Chronic renal failure, stage 3a (HCC)    HTN (hypertension)    Type II diabetes mellitus with renal manifestations (HCC)    History reviewed. No pertinent surgical history. Patient Active Problem List   Diagnosis Date Noted   Acute pulmonary embolism (HCC) 04/24/2022   HTN (hypertension) 04/24/2022   Hypertensive urgency 04/24/2022   Chronic renal failure, stage 3a (HCC) 04/24/2022   Type II diabetes mellitus with renal manifestations (HCC) 04/24/2022   Elevated troponin 04/24/2022    ONSET DATE: 02/14/23  REFERRING DIAG: E11.10 (ICD-10-CM) - Type 2 diabetes mellitus with ketoacidosis without coma L02.612 (ICD-10-CM) - Cutaneous abscess of left foot  THERAPY DIAG:  Other abnormalities of gait and mobility  Rationale for Evaluation and Treatment: Rehabilitation   PATIENT EDUCATION: Education details: Educated on dressing changes if soiled/drains through, POC, scope of PT Person educated: Patient and Spouse Education method: Explanation Education comprehension: verbalized understanding   HOME EXERCISE PROGRAM: N/a  Wound Therapy - 05/08/23 0001     Subjective Saw MD last week and is happy with progress.  Wife  has been changing dressings every other day.  No reports of pain.    Patient and Family Stated Goals wound to heal    Date of Onset 02/14/23    Prior Treatments antibiotics dressing changes    Pain Scale 0-10    Pain Score 0-No pain    Evaluation and Treatment Procedures Explained to Patient/Family Yes    Evaluation and Treatment Procedures agreed to    Wound Properties Date First Assessed: 03/21/23 Time First Assessed: 0740 Wound Type: Diabetic ulcer Location: Heel Location Orientation: Left Wound Description (Comments): L plantar foot wound Present on Admission: Yes   Wound Image Images linked: 1    Dressing Type Gauze (Comment);Moist to dry    Dressing Changed Changed    Dressing Status Old drainage    Dressing Change Frequency PRN    Site / Wound Assessment Yellow;Red    % Wound base Red or Granulating 90%    % Wound base Yellow/Fibrinous Exudate 5%    % Wound base Other/Granulation Tissue (Comment) 5%   callous perimeter   Margins Epibole (rolled edges)   Thick borders, calloused   Drainage Amount Minimal    Drainage Description Serosanguineous    Treatment Cleansed;Debridement (Selective)    Selective Debridement (non-excisional) - Location L foot wound and periwound    Selective Debridement (non-excisional) - Tools Used Scissors;Forceps    Selective Debridement (non-excisional) - Tissue Removed callus, devitalized tissue, epibole    Wound Therapy -  Clinical Statement see below    Wound Therapy - Functional Problem List ambulation    Factors Delaying/Impairing Wound Healing Diabetes Mellitus;Altered sensation;Infection - systemic/local;Immobility;Multiple medical problems;Tobacco use    Hydrotherapy Plan Dressing change;Debridement;Electrical stimulation;Patient/family education;Pulsatile lavage with suction;Ultrasonic wound therapy @35  KHz (+/- 3 KHz)    Wound Therapy - Frequency 2X / week    Wound Therapy - Current Recommendations PT    Wound Plan debride and dressing changes  with dressing to promote correct healing environment.    Dressing  xeroform in woundbed, vaseline applied to perimeter, 4X4 and ABD, kerlix and #5 netting              GOALS: Goals reviewed with patient? Yes  SHORT TERM GOALS: Target date: 04/18/2023    Patient wound to decrease in size by 50% to demonstrate wound healing. Baseline: Goal status: INITIAL   LONG TERM GOALS: Target date: 05/16/2023    Patients wound be healed to reduce risk of infection. Baseline:  Goal status: INITIAL  2.  Patient will verbalize proper skin care techniques in order to reduce risk of further wound development.  Baseline:  Goal status: INITIAL    ASSESSMENT:  CLINICAL IMPRESSION: Wound is approximating nicely with reduction in length noted.  Continues to have thick border of callous limiting healing.  Selective debridement for removal of callous perimeter and slough from wound bed prior application of xeroform, kerlix and coban.  Noted some edema present toes, encouraged pt to elevate and complete ankle pumps with verbalized understanding.  No reports of pain through session.  OBJECTIVE IMPAIRMENTS: Abnormal gait, decreased activity tolerance, decreased balance, decreased endurance, decreased mobility, difficulty walking, decreased ROM, decreased strength, increased edema, impaired flexibility, impaired sensation, improper body mechanics, and pain.   ACTIVITY LIMITATIONS: carrying, lifting, bending, standing, stairs, transfers, bathing, dressing, hygiene/grooming, locomotion level, and caring for others  PARTICIPATION LIMITATIONS: meal prep, cleaning, laundry, shopping, community activity, occupation, and yard work  PERSONAL FACTORS: Fitness, Time since onset of injury/illness/exacerbation, and 3+ comorbidities: DM, smokes, increased BMI, HTN  are also affecting patient's functional outcome.   REHAB POTENTIAL: Fair    CLINICAL DECISION MAKING: Unstable/unpredictable  EVALUATION  COMPLEXITY: High  PLAN: PT FREQUENCY: 2x/week  PT DURATION: 8 weeks  PLANNED INTERVENTIONS: Therapeutic exercises, Therapeutic activity, Neuromuscular re-education, Balance training, Gait training, Patient/Family education, Joint manipulation, Joint mobilization, Stair training, Orthotic/Fit training, DME instructions, Aquatic Therapy, Dry Needling, Electrical stimulation, Spinal manipulation, Spinal mobilization, Cryotherapy, Moist heat, Compression bandaging, scar mobilization, Splintting, Taping, Traction, Ultrasound, Ionotophoresis 4mg /ml Dexamethasone, and Manual therapy   PLAN FOR NEXT SESSION: debride and dressing changes PRN.  Measure and photograph weekly (Wednesdays)  Becky Sax, LPTA/CLT; CBIS [phone]  Juel Burrow, PTA 05/08/2023, 9:56 AM

## 2023-05-11 ENCOUNTER — Ambulatory Visit (HOSPITAL_COMMUNITY): Payer: Medicaid Other | Admitting: Physical Therapy

## 2023-05-11 DIAGNOSIS — L02612 Cutaneous abscess of left foot: Secondary | ICD-10-CM

## 2023-05-11 DIAGNOSIS — R2689 Other abnormalities of gait and mobility: Secondary | ICD-10-CM

## 2023-05-11 NOTE — Therapy (Signed)
OUTPATIENT PHYSICAL THERAPY Wound Treatment   Patient Name: Wyatt Taylor MRN: [id number] DOB:09/23/1966, 57 y.o., male Today's Date: 05/11/2023   PCP: No PCP REFERRING PROVIDER: Alvina Filbert MD  END OF SESSION:  PT End of Session - 05/11/23 0942     Visit Number 15    Number of Visits 16    Date for PT Re-Evaluation 05/16/23    Authorization Type Medicaid [employer]HC community    Authorization Time Period no auth over 21, 30 visit limit    Authorization - Visit Number 15    Authorization - Number of Visits 30    Progress Note Due on Visit 20    PT Start Time 0907    PT Stop Time 0940    PT Time Calculation (min) 33 min    Activity Tolerance Patient tolerated treatment well    Behavior During Therapy WFL for tasks assessed/performed             Past Medical History:  Diagnosis Date   Chronic renal failure, stage 3a (HCC)    HTN (hypertension)    Type II diabetes mellitus with renal manifestations (HCC)    No past surgical history on file. Patient Active Problem List   Diagnosis Date Noted   Acute pulmonary embolism (HCC) 04/24/2022   HTN (hypertension) 04/24/2022   Hypertensive urgency 04/24/2022   Chronic renal failure, stage 3a (HCC) 04/24/2022   Type II diabetes mellitus with renal manifestations (HCC) 04/24/2022   Elevated troponin 04/24/2022    ONSET DATE: 02/14/23  REFERRING DIAG: E11.10 (ICD-10-CM) - Type 2 diabetes mellitus with ketoacidosis without coma L02.612 (ICD-10-CM) - Cutaneous abscess of left foot  THERAPY DIAG:  Other abnormalities of gait and mobility  Abscess of left foot  Rationale for Evaluation and Treatment: Rehabilitation   PATIENT EDUCATION: Education details: Educated on dressing changes if soiled/drains through, POC, scope of PT Person educated: Patient and Spouse Education method: Explanation Education comprehension: verbalized understanding   HOME EXERCISE PROGRAM: N/a  Wound Therapy - 05/11/23 0944     Subjective pt  reports no pain, doing well    Patient and Family Stated Goals wound to heal    Date of Onset 02/14/23    Prior Treatments antibiotics dressing changes    Evaluation and Treatment Procedures Explained to Patient/Family Yes    Evaluation and Treatment Procedures agreed to    Wound Properties Date First Assessed: 03/21/23 Time First Assessed: 0740 Wound Type: Diabetic ulcer Location: Heel Location Orientation: Left Wound Description (Comments): L plantar foot wound Present on Admission: Yes   Wound Image Images linked: 1    Dressing Type Gauze (Comment);Moist to dry    Dressing Changed Changed    Dressing Status Old drainage    Dressing Change Frequency PRN    Site / Wound Assessment Yellow;Red    % Wound base Red or Granulating 90%    % Wound base Yellow/Fibrinous Exudate 5%    % Wound base Other/Granulation Tissue (Comment) 5%   callous, dry tissue perimeter   Wound Length (cm) 7 cm    Wound Width (cm) 1 cm    Wound Depth (cm) 0.5 cm    Wound Volume (cm^3) 3.5 cm^3    Wound Surface Area (cm^2) 7 cm^2    Margins Epibole (rolled edges)   Thick borders, calloused   Drainage Amount Minimal    Drainage Description Serosanguineous    Treatment Cleansed;Debridement (Selective)    Selective Debridement (non-excisional) - Location L foot wound and periwound  Selective Debridement (non-excisional) - Tools Used Scissors;Forceps    Selective Debridement (non-excisional) - Tissue Removed callus, devitalized tissue, epibole    Wound Therapy - Clinical Statement see below    Wound Therapy - Functional Problem List ambulation    Factors Delaying/Impairing Wound Healing Diabetes Mellitus;Altered sensation;Infection - systemic/local;Immobility;Multiple medical problems;Tobacco use    Hydrotherapy Plan Dressing change;Debridement;Electrical stimulation;Patient/family education;Pulsatile lavage with suction;Ultrasonic wound therapy @35  KHz (+/- 3 KHz)    Wound Therapy - Frequency 2X / week    Wound  Therapy - Current Recommendations PT    Wound Plan debride and dressing changes with dressing to promote correct healing environment.    Dressing  xeroform in woundbed, vaseline applied to perimeter, 4X4 and ABD, kerlix and #5 netting               GOALS: Goals reviewed with patient? Yes  SHORT TERM GOALS: Target date: 04/18/2023    Patient wound to decrease in size by 50% to demonstrate wound healing. Baseline: Goal status: INITIAL   LONG TERM GOALS: Target date: 05/16/2023    Patients wound be healed to reduce risk of infection. Baseline:  Goal status: INITIAL  2.  Patient will verbalize proper skin care techniques in order to reduce risk of further wound development.  Baseline:  Goal status: INITIAL    ASSESSMENT:  CLINICAL IMPRESSION: Wound photographed and measured again today.  Continues to approximate well and reduced by half depth as compared to last week.  Min debridement to wound bed but still requires removal of callous and dry tissue perimeter that is preventing approximation. Cleansed and Moisturized foot well prior to redressing. Continued with application of xeroform, kerlix and coban.  Minimal edema  present in toes today.  Continued to encourage pt to elevate and complete ankle pumps with verbalized understanding.  No reports of pain through session.  OBJECTIVE IMPAIRMENTS: Abnormal gait, decreased activity tolerance, decreased balance, decreased endurance, decreased mobility, difficulty walking, decreased ROM, decreased strength, increased edema, impaired flexibility, impaired sensation, improper body mechanics, and pain.   ACTIVITY LIMITATIONS: carrying, lifting, bending, standing, stairs, transfers, bathing, dressing, hygiene/grooming, locomotion level, and caring for others  PARTICIPATION LIMITATIONS: meal prep, cleaning, laundry, shopping, community activity, occupation, and yard work  PERSONAL FACTORS: Fitness, Time since onset of  injury/illness/exacerbation, and 3+ comorbidities: DM, smokes, increased BMI, HTN  are also affecting patient's functional outcome.   REHAB POTENTIAL: Fair    CLINICAL DECISION MAKING: Unstable/unpredictable  EVALUATION COMPLEXITY: High  PLAN: PT FREQUENCY: 2x/week  PT DURATION: 8 weeks  PLANNED INTERVENTIONS: Therapeutic exercises, Therapeutic activity, Neuromuscular re-education, Balance training, Gait training, Patient/Family education, Joint manipulation, Joint mobilization, Stair training, Orthotic/Fit training, DME instructions, Aquatic Therapy, Dry Needling, Electrical stimulation, Spinal manipulation, Spinal mobilization, Cryotherapy, Moist heat, Compression bandaging, scar mobilization, Splintting, Taping, Traction, Ultrasound, Ionotophoresis 4mg /ml Dexamethasone, and Manual therapy   PLAN FOR NEXT SESSION: debride and dressing changes PRN.  Measure and photograph weekly (Wednesdays).  Reassess next session.   Emeline Gins B, PTA 05/11/2023, 9:46 AM

## 2023-05-15 ENCOUNTER — Ambulatory Visit (HOSPITAL_COMMUNITY): Payer: Medicaid Other | Admitting: Physical Therapy

## 2023-05-15 DIAGNOSIS — L02612 Cutaneous abscess of left foot: Secondary | ICD-10-CM

## 2023-05-15 DIAGNOSIS — R2689 Other abnormalities of gait and mobility: Secondary | ICD-10-CM

## 2023-05-15 NOTE — Therapy (Signed)
OUTPATIENT PHYSICAL THERAPY Wound Treatment Progress Note Reporting Period 04/24/2023 to 05/15/2023  See note below for Objective Data and Assessment of Progress/Goals.       Patient Name: Wyatt Taylor MRN: [id number] DOB:Feb 22, 1966, 57 y.o., male Today's Date: 05/15/2023   PCP: No PCP REFERRING PROVIDER: Alvina Filbert MD  END OF SESSION:  PT End of Session - 05/15/23 1112     Visit Number 16    Number of Visits 24    Date for PT Re-Evaluation 06/12/23    Authorization Type Medicaid [employer] community    Authorization Time Period no auth over 21, 30 visit limit    Authorization - Number of Visits 30    Progress Note Due on Visit 26    PT Start Time 1038    PT Stop Time 1104    PT Time Calculation (min) 26 min    Activity Tolerance Patient tolerated treatment well    Behavior During Therapy WFL for tasks assessed/performed             Past Medical History:  Diagnosis Date   Chronic renal failure, stage 3a (HCC)    HTN (hypertension)    Type II diabetes mellitus with renal manifestations (HCC)    No past surgical history on file. Patient Active Problem List   Diagnosis Date Noted   Acute pulmonary embolism (HCC) 04/24/2022   HTN (hypertension) 04/24/2022   Hypertensive urgency 04/24/2022   Chronic renal failure, stage 3a (HCC) 04/24/2022   Type II diabetes mellitus with renal manifestations (HCC) 04/24/2022   Elevated troponin 04/24/2022    ONSET DATE: 02/14/23  REFERRING DIAG: E11.10 (ICD-10-CM) - Type 2 diabetes mellitus with ketoacidosis without coma L02.612 (ICD-10-CM) - Cutaneous abscess of left foot  THERAPY DIAG:  Other abnormalities of gait and mobility  Abscess of left foot  Rationale for Evaluation and Treatment: Rehabilitation   PATIENT EDUCATION: Education details: Educated on dressing changes if soiled/drains through, POC, scope of PT Person educated: Patient and Spouse Education method: Explanation Education comprehension: verbalized  understanding   HOME EXERCISE PROGRAM: N/a  Wound Therapy - 05/15/23 1113     Subjective doing well.  Doesn't return to MD until mid July.    Patient and Family Stated Goals wound to heal    Date of Onset 02/14/23    Prior Treatments antibiotics dressing changes    Evaluation and Treatment Procedures Explained to Patient/Family Yes    Evaluation and Treatment Procedures agreed to    Wound Properties Date First Assessed: 03/21/23 Time First Assessed: 0740 Wound Type: Diabetic ulcer Location: Heel Location Orientation: Left Wound Description (Comments): L plantar foot wound Present on Admission: Yes   Wound Image Images linked: 1    Dressing Type Gauze (Comment);Moist to dry    Dressing Changed Changed    Dressing Status Old drainage    Dressing Change Frequency PRN    Site / Wound Assessment Yellow;Red    % Wound base Red or Granulating 95%    % Wound base Other/Granulation Tissue (Comment) 5%    Wound Length (cm) 6 cm   was 12 cm on 6/5   Wound Width (cm) 0.5 cm   was 1 cm on 6/5   Wound Depth (cm) 0.5 cm   was1cm on 6/5   Wound Volume (cm^3) 1.5 cm^3    Wound Surface Area (cm^2) 3 cm^2    Margins Epibole (rolled edges)   Thick borders, calloused   Drainage Amount Minimal    Drainage Description Serosanguineous  Treatment Cleansed;Debridement (Selective)    Selective Debridement (non-excisional) - Location L foot wound and periwound    Selective Debridement (non-excisional) - Tools Used Scissors;Forceps    Selective Debridement (non-excisional) - Tissue Removed callus, devitalized tissue, epibole    Wound Therapy - Clinical Statement see below    Wound Therapy - Functional Problem List ambulation    Factors Delaying/Impairing Wound Healing Diabetes Mellitus;Altered sensation;Infection - systemic/local;Immobility;Multiple medical problems;Tobacco use    Hydrotherapy Plan Dressing change;Debridement;Electrical stimulation;Patient/family education;Pulsatile lavage with  suction;Ultrasonic wound therapy @35  KHz (+/- 3 KHz)    Wound Therapy - Frequency 2X / week    Wound Therapy - Current Recommendations PT    Wound Plan debride and dressing changes with dressing to promote correct healing environment.    Dressing  xeroform in woundbed, vaseline applied to perimeter, 4X4 and ABD, kerlix and #5 netting               GOALS: Goals reviewed with patient? Yes  SHORT TERM GOALS: Target date: 04/18/2023    Patient wound to decrease in size by 50% to demonstrate wound healing. Baseline: Goal status: INITIAL   LONG TERM GOALS: Target date: 05/16/2023    Patients wound be healed to reduce risk of infection. Baseline:  Goal status: INITIAL  2.  Patient will verbalize proper skin care techniques in order to reduce risk of further wound development.  Baseline:  Goal status: INITIAL    ASSESSMENT:  CLINICAL IMPRESSION: Wound photographed and measured again today for progress note completion.  Wound continues to approximate well and reduced in size.  Min debridement to wound bed but still requires removal of callous and dry tissue perimeter that is preventing approximation. Cleansed and Moisturized foot well prior to redressing. Continued with application of xeroform, kerlix and coban.  Pt will conitnue to benefit from skilled woundcare to prevent infection and promote full healing.   OBJECTIVE IMPAIRMENTS: Abnormal gait, decreased activity tolerance, decreased balance, decreased endurance, decreased mobility, difficulty walking, decreased ROM, decreased strength, increased edema, impaired flexibility, impaired sensation, improper body mechanics, and pain.   ACTIVITY LIMITATIONS: carrying, lifting, bending, standing, stairs, transfers, bathing, dressing, hygiene/grooming, locomotion level, and caring for others  PARTICIPATION LIMITATIONS: meal prep, cleaning, laundry, shopping, community activity, occupation, and yard work  PERSONAL FACTORS: Fitness,  Time since onset of injury/illness/exacerbation, and 3+ comorbidities: DM, smokes, increased BMI, HTN  are also affecting patient's functional outcome.   REHAB POTENTIAL: Fair    CLINICAL DECISION MAKING: Unstable/unpredictable  EVALUATION COMPLEXITY: High  PLAN: PT FREQUENCY: 2x/week  PT DURATION: 4 weeks  PLANNED INTERVENTIONS: Therapeutic exercises, Therapeutic activity, Neuromuscular re-education, Balance training, Gait training, Patient/Family education, Joint manipulation, Joint mobilization, Stair training, Orthotic/Fit training, DME instructions, Aquatic Therapy, Dry Needling, Electrical stimulation, Spinal manipulation, Spinal mobilization, Cryotherapy, Moist heat, Compression bandaging, scar mobilization, Splintting, Taping, Traction, Ultrasound, Ionotophoresis 4mg /ml Dexamethasone, and Manual therapy   PLAN FOR NEXT SESSION: Recert for 4 more weeks, however anticipate wound to be fully healed before then.  debride and dressing changes PRN.  Measure and photograph weekly (Wednesdays).    Bascom Levels, Nomar Broad B, PTA 05/15/2023, 11:18 AM

## 2023-05-16 NOTE — Addendum Note (Signed)
Addended by: Wyman Songster on: 05/16/2023 03:27 PM   Modules accepted: Orders

## 2023-05-18 ENCOUNTER — Ambulatory Visit (HOSPITAL_COMMUNITY): Payer: Medicaid Other | Admitting: Physical Therapy

## 2023-05-19 ENCOUNTER — Encounter (HOSPITAL_COMMUNITY): Payer: Self-pay

## 2023-05-19 ENCOUNTER — Ambulatory Visit (HOSPITAL_COMMUNITY): Payer: Medicaid Other

## 2023-05-19 DIAGNOSIS — R2689 Other abnormalities of gait and mobility: Secondary | ICD-10-CM

## 2023-05-19 DIAGNOSIS — L02612 Cutaneous abscess of left foot: Secondary | ICD-10-CM

## 2023-05-19 NOTE — Therapy (Signed)
OUTPATIENT PHYSICAL THERAPY Wound Treatment  Patient Name: Wyatt Taylor MRN: [id number] DOB:[birth year], 57 y.o., male Today's Date: 05/19/2023   PCP: No PCP REFERRING PROVIDER: Alvina Filbert MD  END OF SESSION:  PT End of Session - 05/19/23 1329     Visit Number 17    Number of Visits 24    Date for PT Re-Evaluation 06/12/23    Authorization Type Medicaid [employer] community    Authorization Time Period no auth over 21, 30 visit limit    Authorization - Visit Number 16    Authorization - Number of Visits 30    Progress Note Due on Visit 26             Past Medical History:  Diagnosis Date   Chronic renal failure, stage 3a (HCC)    HTN (hypertension)    Type II diabetes mellitus with renal manifestations (HCC)    History reviewed. No pertinent surgical history. Patient Active Problem List   Diagnosis Date Noted   Acute pulmonary embolism (HCC) 04/24/2022   HTN (hypertension) 04/24/2022   Hypertensive urgency 04/24/2022   Chronic renal failure, stage 3a (HCC) 04/24/2022   Type II diabetes mellitus with renal manifestations (HCC) 04/24/2022   Elevated troponin 04/24/2022    ONSET DATE: 02/14/23  REFERRING DIAG: E11.10 (ICD-10-CM) - Type 2 diabetes mellitus with ketoacidosis without coma L02.612 (ICD-10-CM) - Cutaneous abscess of left foot  THERAPY DIAG:  Other abnormalities of gait and mobility  Abscess of left foot  Rationale for Evaluation and Treatment: Rehabilitation   PATIENT EDUCATION: Education details: Educated on dressing changes if soiled/drains through, POC, scope of PT Person educated: Patient and Spouse Education method: Explanation Education comprehension: verbalized understanding   HOME EXERCISE PROGRAM: N/a 05/19/23:  Ankle pumps Toe curls Elevation for edema ETI handout for edema control   Wound Therapy - 05/19/23 0001     Subjective Pt arrived with wife, changed dressings on Wednesday per MD instruction with gauze and ACE bandages.   No reports of pain today.    Patient and Family Stated Goals wound to heal    Date of Onset 02/14/23    Prior Treatments antibiotics dressing changes    Pain Scale 0-10    Pain Score 0-No pain    Evaluation and Treatment Procedures Explained to Patient/Family Yes    Evaluation and Treatment Procedures agreed to    Wound Properties Date First Assessed: 03/21/23 Time First Assessed: 0740 Wound Type: Diabetic ulcer Location: Heel Location Orientation: Left Wound Description (Comments): L plantar foot wound Present on Admission: Yes   Wound Image Images linked: 1    Dressing Type Gauze (Comment);Moist to dry    Dressing Changed Changed    Dressing Status Old drainage    Dressing Change Frequency PRN    Site / Wound Assessment Yellow;Red    % Wound base Red or Granulating 95%    % Wound base Other/Granulation Tissue (Comment) 5%   callous, dry tissue perimeter   Wound Length (cm) 6 cm    Wound Width (cm) --   superior .3, interior .8   Wound Depth (cm) 0.4 cm    Margins Epibole (rolled edges)   Thick callous border   Drainage Amount Minimal    Drainage Description Serosanguineous    Treatment Cleansed;Debridement (Selective)    Selective Debridement (non-excisional) - Location L foot wound and periwound    Selective Debridement (non-excisional) - Tools Used Scissors;Forceps    Selective Debridement (non-excisional) - Tissue Removed callus, devitalized tissue, epibole  Wound Therapy - Clinical Statement see below    Wound Therapy - Functional Problem List ambulation    Factors Delaying/Impairing Wound Healing Diabetes Mellitus;Altered sensation;Infection - systemic/local;Immobility;Multiple medical problems;Tobacco use    Hydrotherapy Plan Dressing change;Debridement;Electrical stimulation;Patient/family education;Pulsatile lavage with suction;Ultrasonic wound therapy @35  KHz (+/- 3 KHz)    Wound Therapy - Frequency 2X / week    Wound Therapy - Current Recommendations PT    Wound Plan  debride and dressing changes with dressing to promote correct healing environment.    Dressing  xeroform in woundbed, vaseline applied to perimeter, 4X4 and ABD, kerlix, coban and #5 netting (P)                   GOALS: Goals reviewed with patient? Yes  SHORT TERM GOALS: Target date: 04/18/2023    Patient wound to decrease in size by 50% to demonstrate wound healing. Baseline: Goal status: INITIAL   LONG TERM GOALS: Target date: 05/16/2023    Patients wound be healed to reduce risk of infection. Baseline:  Goal status: INITIAL  2.  Patient will verbalize proper skin care techniques in order to reduce risk of further wound development.  Baseline:  Goal status: INITIAL    ASSESSMENT:  CLINICAL IMPRESSION: Pt stated he continues to have swelling in toes, educated benefits with elevation, ankle/foot pumps and compression garments.  Measurements taken and given handout for ETI for compression garments as reports swelling has been present prior injury.  Selective debridement complete to address thick callous border of the wound to promote approximation.  Wound continues to improve in approximation and healing nicely.  Continued with xeroform, ABD pad, kerlix, coban and netting.  Reports of comfort at EOS.    OBJECTIVE IMPAIRMENTS: Abnormal gait, decreased activity tolerance, decreased balance, decreased endurance, decreased mobility, difficulty walking, decreased ROM, decreased strength, increased edema, impaired flexibility, impaired sensation, improper body mechanics, and pain.   ACTIVITY LIMITATIONS: carrying, lifting, bending, standing, stairs, transfers, bathing, dressing, hygiene/grooming, locomotion level, and caring for others  PARTICIPATION LIMITATIONS: meal prep, cleaning, laundry, shopping, community activity, occupation, and yard work  PERSONAL FACTORS: Fitness, Time since onset of injury/illness/exacerbation, and 3+ comorbidities: DM, smokes, increased BMI, HTN   are also affecting patient's functional outcome.   REHAB POTENTIAL: Fair    CLINICAL DECISION MAKING: Unstable/unpredictable  EVALUATION COMPLEXITY: High  PLAN: PT FREQUENCY: 2x/week  PT DURATION: 4 weeks  PLANNED INTERVENTIONS: Therapeutic exercises, Therapeutic activity, Neuromuscular re-education, Balance training, Gait training, Patient/Family education, Joint manipulation, Joint mobilization, Stair training, Orthotic/Fit training, DME instructions, Aquatic Therapy, Dry Needling, Electrical stimulation, Spinal manipulation, Spinal mobilization, Cryotherapy, Moist heat, Compression bandaging, scar mobilization, Splintting, Taping, Traction, Ultrasound, Ionotophoresis 4mg /ml Dexamethasone, and Manual therapy   PLAN FOR NEXT SESSION: debride and dressing changes PRN.  Measure and photograph weekly (Wednesdays).    Becky Sax, LPTA/CLT; CBIS [id number]  Juel Burrow, PTA 05/19/2023, 1:30 PM

## 2023-05-22 ENCOUNTER — Ambulatory Visit (HOSPITAL_COMMUNITY): Payer: Medicaid Other | Attending: Internal Medicine

## 2023-05-22 ENCOUNTER — Encounter (HOSPITAL_COMMUNITY): Payer: Self-pay

## 2023-05-22 DIAGNOSIS — L02612 Cutaneous abscess of left foot: Secondary | ICD-10-CM | POA: Diagnosis present

## 2023-05-22 DIAGNOSIS — R2689 Other abnormalities of gait and mobility: Secondary | ICD-10-CM | POA: Insufficient documentation

## 2023-05-22 NOTE — Therapy (Unsigned)
OUTPATIENT PHYSICAL THERAPY Wound Treatment  Patient Name: Wyatt Taylor MRN: [id number] DOB:February 27, 1966, 57 y.o., male Today's Date: 05/22/2023   PCP: No PCP REFERRING PROVIDER: Alvina Filbert MD  END OF SESSION:  PT End of Session - 05/22/23 1239     Visit Number 18    Number of Visits 24    Date for PT Re-Evaluation 06/12/23    Authorization Type Medicaid [employer] community    Authorization Time Period no auth over 21, 30 visit limit    Authorization - Visit Number 17    Authorization - Number of Visits 30    Progress Note Due on Visit 26    PT Start Time 1145    PT Stop Time 1220    PT Time Calculation (min) 35 min    Activity Tolerance Patient tolerated treatment well    Behavior During Therapy WFL for tasks assessed/performed             Past Medical History:  Diagnosis Date   Chronic renal failure, stage 3a (HCC)    HTN (hypertension)    Type II diabetes mellitus with renal manifestations (HCC)    History reviewed. No pertinent surgical history. Patient Active Problem List   Diagnosis Date Noted   Acute pulmonary embolism (HCC) 04/24/2022   HTN (hypertension) 04/24/2022   Hypertensive urgency 04/24/2022   Chronic renal failure, stage 3a (HCC) 04/24/2022   Type II diabetes mellitus with renal manifestations (HCC) 04/24/2022   Elevated troponin 04/24/2022    ONSET DATE: 02/14/23  REFERRING DIAG: E11.10 (ICD-10-CM) - Type 2 diabetes mellitus with ketoacidosis without coma L02.612 (ICD-10-CM) - Cutaneous abscess of left foot  THERAPY DIAG:  Other abnormalities of gait and mobility  Abscess of left foot  Rationale for Evaluation and Treatment: Rehabilitation   PATIENT EDUCATION: Education details: Educated on dressing changes if soiled/drains through, POC, scope of PT Person educated: Patient and Spouse Education method: Explanation Education comprehension: verbalized understanding   HOME EXERCISE PROGRAM: N/a 05/19/23:  Ankle pumps Toe  curls Elevation for edema ETI handout for edema control  Wound Therapy - 05/22/23 0001     Subjective Pt arrived with dressings intact, no reports of pain.    Patient and Family Stated Goals wound to heal    Date of Onset 02/14/23    Prior Treatments antibiotics dressing changes    Pain Scale 0-10    Pain Score 0-No pain    Evaluation and Treatment Procedures Explained to Patient/Family Yes    Evaluation and Treatment Procedures agreed to    Wound Properties Date First Assessed: 03/21/23 Time First Assessed: 0740 Wound Type: Diabetic ulcer Location: Heel Location Orientation: Left Wound Description (Comments): L plantar foot wound Present on Admission: Yes   Wound Image Images linked: 1    Dressing Type Gauze (Comment);Moist to dry    Dressing Changed Changed    Dressing Status Old drainage    Dressing Change Frequency PRN    Site / Wound Assessment Yellow;Red    % Wound base Red or Granulating 95%    % Wound base Other/Granulation Tissue (Comment) 5%   callous, dry skin perimeter   Margins Epibole (rolled edges)   Thick callous border   Drainage Amount Minimal    Drainage Description Serosanguineous    Treatment Cleansed;Debridement (Selective)    Selective Debridement (non-excisional) - Location L foot wound and periwound    Selective Debridement (non-excisional) - Tools Used Scissors;Forceps    Selective Debridement (non-excisional) - Tissue Removed callus, devitalized tissue, epibole  Wound Therapy - Clinical Statement see below    Wound Therapy - Functional Problem List ambulation    Factors Delaying/Impairing Wound Healing Diabetes Mellitus;Altered sensation;Infection - systemic/local;Immobility;Multiple medical problems;Tobacco use    Hydrotherapy Plan Dressing change;Debridement;Electrical stimulation;Patient/family education;Pulsatile lavage with suction;Ultrasonic wound therapy @35  KHz (+/- 3 KHz)    Wound Therapy - Frequency 2X / week    Wound Therapy - Current  Recommendations PT    Wound Plan debride and dressing changes with dressing to promote correct healing environment.    Dressing  xeroform in woundbed, vaseline applied to perimeter, 4X4 and ABD, kerlix, coban and #5 netting             Wound Therapy - 05/22/23 0001     Subjective Pt arrived with dressings intact, no reports of pain.    Patient and Family Stated Goals wound to heal    Date of Onset 02/14/23    Prior Treatments antibiotics dressing changes    Pain Scale 0-10    Pain Score 0-No pain    Evaluation and Treatment Procedures Explained to Patient/Family Yes    Evaluation and Treatment Procedures agreed to    Wound Properties Date First Assessed: 03/21/23 Time First Assessed: 0740 Wound Type: Diabetic ulcer Location: Heel Location Orientation: Left Wound Description (Comments): L plantar foot wound Present on Admission: Yes   Wound Image Images linked: 1    Dressing Type Gauze (Comment);Moist to dry    Dressing Changed Changed    Dressing Status Old drainage    Dressing Change Frequency PRN    Site / Wound Assessment Yellow;Red    % Wound base Red or Granulating 95%    % Wound base Other/Granulation Tissue (Comment) 5%   callous, dry skin perimeter   Margins Epibole (rolled edges)   Thick callous border   Drainage Amount Minimal    Drainage Description Serosanguineous    Treatment Cleansed;Debridement (Selective)    Selective Debridement (non-excisional) - Location L foot wound and periwound    Selective Debridement (non-excisional) - Tools Used Scissors;Forceps    Selective Debridement (non-excisional) - Tissue Removed callus, devitalized tissue, epibole    Wound Therapy - Clinical Statement see below    Wound Therapy - Functional Problem List ambulation    Factors Delaying/Impairing Wound Healing Diabetes Mellitus;Altered sensation;Infection - systemic/local;Immobility;Multiple medical problems;Tobacco use    Hydrotherapy Plan Dressing change;Debridement;Electrical  stimulation;Patient/family education;Pulsatile lavage with suction;Ultrasonic wound therapy @35  KHz (+/- 3 KHz)    Wound Therapy - Frequency 2X / week    Wound Therapy - Current Recommendations PT    Wound Plan debride and dressing changes with dressing to promote correct healing environment.    Dressing  xeroform in woundbed, vaseline applied to perimeter, 4X4 and ABD, kerlix, coban and #5 netting                  GOALS: Goals reviewed with patient? Yes  SHORT TERM GOALS: Target date: 04/18/2023    Patient wound to decrease in size by 50% to demonstrate wound healing. Baseline: Goal status: INITIAL   LONG TERM GOALS: Target date: 05/16/2023    Patients wound be healed to reduce risk of infection. Baseline:  Goal status: INITIAL  2.  Patient will verbalize proper skin care techniques in order to reduce risk of further wound development.  Baseline:  Goal status: INITIAL    ASSESSMENT:  CLINICAL IMPRESSION: Wound continues to approximate nicely and noted reduction in edema in toes.  Continued selective debridement for removal of callous  perimeter to promote healing.  Wound cleansed well and moisturized prior reapplication of xeroform, ABD pad, kerlix, coban and netting with reports of comfort at EOS.    OBJECTIVE IMPAIRMENTS: Abnormal gait, decreased activity tolerance, decreased balance, decreased endurance, decreased mobility, difficulty walking, decreased ROM, decreased strength, increased edema, impaired flexibility, impaired sensation, improper body mechanics, and pain.   ACTIVITY LIMITATIONS: carrying, lifting, bending, standing, stairs, transfers, bathing, dressing, hygiene/grooming, locomotion level, and caring for others  PARTICIPATION LIMITATIONS: meal prep, cleaning, laundry, shopping, community activity, occupation, and yard work  PERSONAL FACTORS: Fitness, Time since onset of injury/illness/exacerbation, and 3+ comorbidities: DM, smokes, increased BMI, HTN   are also affecting patient's functional outcome.   REHAB POTENTIAL: Fair    CLINICAL DECISION MAKING: Unstable/unpredictable  EVALUATION COMPLEXITY: High  PLAN: PT FREQUENCY: 2x/week  PT DURATION: 4 weeks  PLANNED INTERVENTIONS: Therapeutic exercises, Therapeutic activity, Neuromuscular re-education, Balance training, Gait training, Patient/Family education, Joint manipulation, Joint mobilization, Stair training, Orthotic/Fit training, DME instructions, Aquatic Therapy, Dry Needling, Electrical stimulation, Spinal manipulation, Spinal mobilization, Cryotherapy, Moist heat, Compression bandaging, scar mobilization, Splintting, Taping, Traction, Ultrasound, Ionotophoresis 4mg /ml Dexamethasone, and Manual therapy   PLAN FOR NEXT SESSION: debride and dressing changes PRN.  Measure and photograph weekly (Wednesdays).    Becky Sax, LPTA/CLT; CBIS [phone]  Juel Burrow, PTA 05/22/2023, 12:40 PM

## 2023-05-24 ENCOUNTER — Ambulatory Visit (HOSPITAL_COMMUNITY): Payer: Medicaid Other

## 2023-05-24 ENCOUNTER — Encounter (HOSPITAL_COMMUNITY): Payer: Self-pay

## 2023-05-24 DIAGNOSIS — R2689 Other abnormalities of gait and mobility: Secondary | ICD-10-CM | POA: Diagnosis not present

## 2023-05-24 DIAGNOSIS — L02612 Cutaneous abscess of left foot: Secondary | ICD-10-CM

## 2023-05-24 NOTE — Therapy (Signed)
OUTPATIENT PHYSICAL THERAPY Wound Treatment  Patient Name: Wyatt Taylor MRN: [id number] DOB:1966/10/10, 57 y.o., male Today's Date: 05/24/2023   PCP: No PCP REFERRING PROVIDER: Alvina Filbert MD  END OF SESSION:  PT End of Session - 05/24/23 0950     Visit Number 19    Number of Visits 24    Date for PT Re-Evaluation 06/12/23    Authorization Type Medicaid [employer] community    Authorization Time Period no auth over 21, 30 visit limit    Authorization - Visit Number 18    Authorization - Number of Visits 30    Progress Note Due on Visit 26    PT Start Time 0906    PT Stop Time 0932    PT Time Calculation (min) 26 min    Activity Tolerance Patient tolerated treatment well    Behavior During Therapy WFL for tasks assessed/performed              Past Medical History:  Diagnosis Date   Chronic renal failure, stage 3a (HCC)    HTN (hypertension)    Type II diabetes mellitus with renal manifestations (HCC)    History reviewed. No pertinent surgical history. Patient Active Problem List   Diagnosis Date Noted   Acute pulmonary embolism (HCC) 04/24/2022   HTN (hypertension) 04/24/2022   Hypertensive urgency 04/24/2022   Chronic renal failure, stage 3a (HCC) 04/24/2022   Type II diabetes mellitus with renal manifestations (HCC) 04/24/2022   Elevated troponin 04/24/2022    ONSET DATE: 02/14/23  REFERRING DIAG: E11.10 (ICD-10-CM) - Type 2 diabetes mellitus with ketoacidosis without coma L02.612 (ICD-10-CM) - Cutaneous abscess of left foot  THERAPY DIAG:  Other abnormalities of gait and mobility  Abscess of left foot  Rationale for Evaluation and Treatment: Rehabilitation   PATIENT EDUCATION: Education details: Educated on dressing changes if soiled/drains through, POC, scope of PT Person educated: Patient and Spouse Education method: Explanation Education comprehension: verbalized understanding   HOME EXERCISE PROGRAM: N/a 05/19/23:  Ankle pumps Toe  curls Elevation for edema ETI handout for edema control  Wound Therapy - 05/24/23 0001     Subjective Arrived with dressings intact, no reports of pain today.    Patient and Family Stated Goals wound to heal    Date of Onset 02/14/23    Prior Treatments antibiotics dressing changes    Pain Scale 0-10    Pain Score 0-No pain    Evaluation and Treatment Procedures Explained to Patient/Family Yes    Evaluation and Treatment Procedures agreed to    Wound Properties Date First Assessed: 03/21/23 Time First Assessed: 0740 Wound Type: Diabetic ulcer Location: Heel Location Orientation: Left Wound Description (Comments): L plantar foot wound Present on Admission: Yes   Wound Image Images linked: 1    Dressing Type Gauze (Comment);Moist to dry    Dressing Changed Changed    Dressing Status Old drainage    Dressing Change Frequency PRN    Site / Wound Assessment Yellow;Red    % Wound base Red or Granulating 95%    % Wound base Other/Granulation Tissue (Comment) 5%   callous   Wound Length (cm) 4.8 cm    Wound Width (cm) --   superior .3, inferior .6   Wound Depth (cm) 0.4 cm    Margins Epibole (rolled edges)   thick callous border   Drainage Amount Minimal    Drainage Description Serosanguineous    Treatment Cleansed;Debridement (Selective)    Selective Debridement (non-excisional) - Location L foot  wound and periwound    Selective Debridement (non-excisional) - Tools Used Forceps;Scalpel    Selective Debridement (non-excisional) - Tissue Removed callus, devitalized tissue, epibole    Wound Therapy - Clinical Statement see below    Wound Therapy - Functional Problem List ambulation    Factors Delaying/Impairing Wound Healing Diabetes Mellitus;Altered sensation;Infection - systemic/local;Immobility;Multiple medical problems;Tobacco use    Hydrotherapy Plan Dressing change;Debridement;Electrical stimulation;Patient/family education;Pulsatile lavage with suction;Ultrasonic wound therapy @35  KHz  (+/- 3 KHz)    Wound Therapy - Frequency 2X / week    Wound Therapy - Current Recommendations PT    Wound Plan debride and dressing changes with dressing to promote correct healing environment.    Dressing  xeroform in woundbed, vaseline applied to perimeter, 4X4 and ABD, kerlix, coban and #5 netting                 GOALS: Goals reviewed with patient? Yes  SHORT TERM GOALS: Target date: 04/18/2023    Patient wound to decrease in size by 50% to demonstrate wound healing. Baseline: Goal status: INITIAL   LONG TERM GOALS: Target date: 05/16/2023    Patients wound be healed to reduce risk of infection. Baseline:  Goal status: INITIAL  2.  Patient will verbalize proper skin care techniques in order to reduce risk of further wound development.  Baseline:  Goal status: INITIAL    ASSESSMENT:  CLINICAL IMPRESSION: Selective debridement mainly focus on thick callous perimeter of wound to promote healing.  Wound continues to approximately nicely with noted reduction in wound size.  Wound cleansed well and moisturized prior application of xeroform, kerlix, coban and netting.  OBJECTIVE IMPAIRMENTS: Abnormal gait, decreased activity tolerance, decreased balance, decreased endurance, decreased mobility, difficulty walking, decreased ROM, decreased strength, increased edema, impaired flexibility, impaired sensation, improper body mechanics, and pain.   ACTIVITY LIMITATIONS: carrying, lifting, bending, standing, stairs, transfers, bathing, dressing, hygiene/grooming, locomotion level, and caring for others  PARTICIPATION LIMITATIONS: meal prep, cleaning, laundry, shopping, community activity, occupation, and yard work  PERSONAL FACTORS: Fitness, Time since onset of injury/illness/exacerbation, and 3+ comorbidities: DM, smokes, increased BMI, HTN  are also affecting patient's functional outcome.   REHAB POTENTIAL: Fair    CLINICAL DECISION MAKING:  Unstable/unpredictable  EVALUATION COMPLEXITY: High  PLAN: PT FREQUENCY: 2x/week  PT DURATION: 4 weeks  PLANNED INTERVENTIONS: Therapeutic exercises, Therapeutic activity, Neuromuscular re-education, Balance training, Gait training, Patient/Family education, Joint manipulation, Joint mobilization, Stair training, Orthotic/Fit training, DME instructions, Aquatic Therapy, Dry Needling, Electrical stimulation, Spinal manipulation, Spinal mobilization, Cryotherapy, Moist heat, Compression bandaging, scar mobilization, Splintting, Taping, Traction, Ultrasound, Ionotophoresis 4mg /ml Dexamethasone, and Manual therapy   PLAN FOR NEXT SESSION: debride and dressing changes PRN.  Measure and photograph weekly (Wednesdays).    Becky Sax, LPTA/CLT; CBIS [phone]  Juel Burrow, PTA 05/24/2023, 9:50 AM

## 2023-05-29 ENCOUNTER — Encounter (HOSPITAL_COMMUNITY): Payer: Self-pay | Admitting: Physical Therapy

## 2023-05-29 ENCOUNTER — Ambulatory Visit (HOSPITAL_COMMUNITY): Payer: Medicaid Other | Admitting: Physical Therapy

## 2023-05-29 DIAGNOSIS — R2689 Other abnormalities of gait and mobility: Secondary | ICD-10-CM

## 2023-05-29 DIAGNOSIS — L02612 Cutaneous abscess of left foot: Secondary | ICD-10-CM

## 2023-05-29 NOTE — Therapy (Signed)
OUTPATIENT PHYSICAL THERAPY Wound Treatment  Patient Name: Wyatt Taylor MRN: [id number] DOB:20-Jan-1966, 57 y.o., male Today's Date: 05/29/2023   PCP: No PCP REFERRING PROVIDER: Alvina Filbert MD  END OF SESSION:  PT End of Session - 05/29/23 1031     Visit Number 20    Number of Visits 24    Date for PT Re-Evaluation 06/12/23    Authorization Type Medicaid [employer] community    Authorization Time Period no auth over 21, 30 visit limit    Authorization - Visit Number 19    Authorization - Number of Visits 30    Progress Note Due on Visit 26    PT Start Time 1032    PT Stop Time 1106    PT Time Calculation (min) 34 min    Activity Tolerance Patient tolerated treatment well    Behavior During Therapy WFL for tasks assessed/performed              Past Medical History:  Diagnosis Date   Chronic renal failure, stage 3a (HCC)    HTN (hypertension)    Type II diabetes mellitus with renal manifestations (HCC)    History reviewed. No pertinent surgical history. Patient Active Problem List   Diagnosis Date Noted   Acute pulmonary embolism (HCC) 04/24/2022   HTN (hypertension) 04/24/2022   Hypertensive urgency 04/24/2022   Chronic renal failure, stage 3a (HCC) 04/24/2022   Type II diabetes mellitus with renal manifestations (HCC) 04/24/2022   Elevated troponin 04/24/2022    ONSET DATE: 02/14/23  REFERRING DIAG: E11.10 (ICD-10-CM) - Type 2 diabetes mellitus with ketoacidosis without coma L02.612 (ICD-10-CM) - Cutaneous abscess of left foot  THERAPY DIAG:  Other abnormalities of gait and mobility  Abscess of left foot  Rationale for Evaluation and Treatment: Rehabilitation   PATIENT EDUCATION: Education details: Educated on dressing changes if soiled/drains through, POC, scope of PT Person educated: Patient and Spouse Education method: Explanation Education comprehension: verbalized understanding   HOME EXERCISE PROGRAM: N/a 05/19/23:  Ankle pumps Toe  curls Elevation for edema ETI handout for edema control  Wound Therapy - 05/29/23 0001     Subjective No new issues    Patient and Family Stated Goals wound to heal    Date of Onset 02/14/23    Prior Treatments antibiotics dressing changes    Pain Score 0-No pain    Evaluation and Treatment Procedures Explained to Patient/Family Yes    Evaluation and Treatment Procedures agreed to    Wound Properties Date First Assessed: 03/21/23 Time First Assessed: 0740 Wound Type: Diabetic ulcer Location: Heel Location Orientation: Left Wound Description (Comments): L plantar foot wound Present on Admission: Yes   Dressing Type Gauze (Comment);Moist to dry    Dressing Changed Changed    Dressing Status Old drainage    Dressing Change Frequency PRN    Site / Wound Assessment Yellow;Red    % Wound base Red or Granulating 95%    % Wound base Other/Granulation Tissue (Comment) 5%   callous   Margins Epibole (rolled edges)   thick callous border   Drainage Amount Scant    Drainage Description Serosanguineous    Treatment Cleansed;Debridement (Selective)    Selective Debridement (non-excisional) - Location L foot wound and periwound    Selective Debridement (non-excisional) - Tools Used Forceps;Scalpel    Selective Debridement (non-excisional) - Tissue Removed callus, devitalized tissue, epibole    Wound Therapy - Clinical Statement see below    Wound Therapy - Functional Problem List ambulation  Factors Delaying/Impairing Wound Healing Diabetes Mellitus;Altered sensation;Infection - systemic/local;Immobility;Multiple medical problems;Tobacco use    Hydrotherapy Plan Dressing change;Debridement;Electrical stimulation;Patient/family education;Pulsatile lavage with suction;Ultrasonic wound therapy @35  KHz (+/- 3 KHz)    Wound Therapy - Frequency 2X / week    Wound Therapy - Current Recommendations PT    Wound Plan debride and dressing changes with dressing to promote correct healing environment.     Dressing  xeroform in woundbed, vaseline applied to perimeter, 4X4 and ABD, kerlix, coban and #5 netting                 GOALS: Goals reviewed with patient? Yes  SHORT TERM GOALS: Target date: 04/18/2023    Patient wound to decrease in size by 50% to demonstrate wound healing. Baseline: Goal status: INITIAL   LONG TERM GOALS: Target date: 05/16/2023    Patients wound be healed to reduce risk of infection. Baseline:  Goal status: INITIAL  2.  Patient will verbalize proper skin care techniques in order to reduce risk of further wound development.  Baseline:  Goal status: INITIAL    ASSESSMENT:  CLINICAL IMPRESSION: Patient with significant callous buildup at/surrounding wound region. Selective debridement mainly focus on thick callous perimeter of wound to promote healing.  It is difficult to assess if some of the wound is still present due to thick callous build up and upon further debridement several small areas are revealed. A good amount of callous remaining following attempts for removal. Continued with xeroform to small wound area. Will likely be healed in the next few sessions.    OBJECTIVE IMPAIRMENTS: Abnormal gait, decreased activity tolerance, decreased balance, decreased endurance, decreased mobility, difficulty walking, decreased ROM, decreased strength, increased edema, impaired flexibility, impaired sensation, improper body mechanics, and pain.   ACTIVITY LIMITATIONS: carrying, lifting, bending, standing, stairs, transfers, bathing, dressing, hygiene/grooming, locomotion level, and caring for others  PARTICIPATION LIMITATIONS: meal prep, cleaning, laundry, shopping, community activity, occupation, and yard work  PERSONAL FACTORS: Fitness, Time since onset of injury/illness/exacerbation, and 3+ comorbidities: DM, smokes, increased BMI, HTN  are also affecting patient's functional outcome.   REHAB POTENTIAL: Fair    CLINICAL DECISION MAKING:  Unstable/unpredictable  EVALUATION COMPLEXITY: High  PLAN: PT FREQUENCY: 2x/week  PT DURATION: 4 weeks  PLANNED INTERVENTIONS: Therapeutic exercises, Therapeutic activity, Neuromuscular re-education, Balance training, Gait training, Patient/Family education, Joint manipulation, Joint mobilization, Stair training, Orthotic/Fit training, DME instructions, Aquatic Therapy, Dry Needling, Electrical stimulation, Spinal manipulation, Spinal mobilization, Cryotherapy, Moist heat, Compression bandaging, scar mobilization, Splintting, Taping, Traction, Ultrasound, Ionotophoresis 4mg /ml Dexamethasone, and Manual therapy   PLAN FOR NEXT SESSION: debride and dressing changes PRN.  Measure and photograph weekly (Wednesdays).     Reola Mosher Alison Kubicki, PT 05/29/2023, 11:11 AM

## 2023-05-31 ENCOUNTER — Ambulatory Visit (HOSPITAL_COMMUNITY): Payer: Medicaid Other | Admitting: Physical Therapy

## 2023-05-31 ENCOUNTER — Encounter (HOSPITAL_COMMUNITY): Payer: Self-pay | Admitting: Physical Therapy

## 2023-05-31 DIAGNOSIS — R2689 Other abnormalities of gait and mobility: Secondary | ICD-10-CM | POA: Diagnosis not present

## 2023-05-31 DIAGNOSIS — L02612 Cutaneous abscess of left foot: Secondary | ICD-10-CM

## 2023-05-31 NOTE — Therapy (Signed)
OUTPATIENT PHYSICAL THERAPY Wound Treatment  Patient Name: Wyatt Taylor MRN: [id number] DOB:Jan 07, 1966, 57 y.o., male Today's Date: 05/31/2023   PCP: No PCP REFERRING PROVIDER: Alvina Filbert MD  END OF SESSION:  PT End of Session - 05/31/23 1105     Visit Number 21    Number of Visits 24    Date for PT Re-Evaluation 06/12/23    Authorization Type Medicaid [employer] community    Authorization Time Period no auth over 21, 30 visit limit    Authorization - Visit Number 20    Authorization - Number of Visits 30    Progress Note Due on Visit 26    PT Start Time [phone]    PT Stop Time 1058    PT Time Calculation (min) 65 min    Activity Tolerance Patient tolerated treatment well    Behavior During Therapy WFL for tasks assessed/performed              Past Medical History:  Diagnosis Date   Chronic renal failure, stage 3a (HCC)    HTN (hypertension)    Type II diabetes mellitus with renal manifestations (HCC)    History reviewed. No pertinent surgical history. Patient Active Problem List   Diagnosis Date Noted   Acute pulmonary embolism (HCC) 04/24/2022   HTN (hypertension) 04/24/2022   Hypertensive urgency 04/24/2022   Chronic renal failure, stage 3a (HCC) 04/24/2022   Type II diabetes mellitus with renal manifestations (HCC) 04/24/2022   Elevated troponin 04/24/2022    ONSET DATE: 02/14/23  REFERRING DIAG: E11.10 (ICD-10-CM) - Type 2 diabetes mellitus with ketoacidosis without coma L02.612 (ICD-10-CM) - Cutaneous abscess of left foot  THERAPY DIAG:  Other abnormalities of gait and mobility  Abscess of left foot  Rationale for Evaluation and Treatment: Rehabilitation   PATIENT EDUCATION: Education details: Educated on dressing changes if soiled/drains through, POC, scope of PT Person educated: Patient and Spouse Education method: Explanation Education comprehension: verbalized understanding   HOME EXERCISE PROGRAM: N/a 05/19/23:  Ankle pumps Toe  curls Elevation for edema ETI handout for edema control  Wound Therapy - 05/31/23 0001     Subjective Eager for wound to heal    Patient and Family Stated Goals wound to heal    Date of Onset 02/14/23    Prior Treatments antibiotics dressing changes    Pain Score 0-No pain    Evaluation and Treatment Procedures Explained to Patient/Family Yes    Evaluation and Treatment Procedures agreed to    Wound Properties Date First Assessed: 03/21/23 Time First Assessed: 0740 Wound Type: Diabetic ulcer Location: Heel Location Orientation: Left Wound Description (Comments): L plantar foot wound Present on Admission: Yes   Wound Image Images linked: 1    Dressing Type Gauze (Comment);Moist to dry    Dressing Changed Changed    Dressing Status Old drainage    Dressing Change Frequency PRN    Site / Wound Assessment Yellow;Red    % Wound base Red or Granulating 100%    Margins Epibole (rolled edges)   thick callous border   Drainage Amount Scant    Drainage Description Serosanguineous    Treatment Cleansed;Debridement (Selective)    Selective Debridement (non-excisional) - Location L foot wound and periwound    Selective Debridement (non-excisional) - Tools Used Forceps;Scalpel    Selective Debridement (non-excisional) - Tissue Removed callus, devitalized tissue, epibole    Wound Therapy - Clinical Statement see below    Wound Therapy - Functional Problem List ambulation    Factors  Delaying/Impairing Wound Healing Diabetes Mellitus;Altered sensation;Infection - systemic/local;Immobility;Multiple medical problems;Tobacco use    Hydrotherapy Plan Dressing change;Debridement;Electrical stimulation;Patient/family education;Pulsatile lavage with suction;Ultrasonic wound therapy @35  KHz (+/- 3 KHz)    Wound Therapy - Frequency 2X / week    Wound Therapy - Current Recommendations PT    Wound Plan debride and dressing changes with dressing to promote correct healing environment.    Dressing  xeroform in  woundbed, vaseline applied to perimeter, 4X4 and ABD, kerlix, coban and #5 netting                 GOALS: Goals reviewed with patient? Yes  SHORT TERM GOALS: Target date: 04/18/2023    Patient wound to decrease in size by 50% to demonstrate wound healing. Baseline: Goal status: INITIAL   LONG TERM GOALS: Target date: 05/16/2023    Patients wound be healed to reduce risk of infection. Baseline:  Goal status: INITIAL  2.  Patient will verbalize proper skin care techniques in order to reduce risk of further wound development.  Baseline:  Goal status: INITIAL    ASSESSMENT:  CLINICAL IMPRESSION: Patient with significant callous buildup at/surrounding wound region. Selective debridement mainly focus on thick callous perimeter of wound to promote healing. Able to debride a significant amount of callous from periwound. 2 very small open wounds remain and are likely to be healed by next session. Continued with same dressing as it is healing well.    OBJECTIVE IMPAIRMENTS: Abnormal gait, decreased activity tolerance, decreased balance, decreased endurance, decreased mobility, difficulty walking, decreased ROM, decreased strength, increased edema, impaired flexibility, impaired sensation, improper body mechanics, and pain.   ACTIVITY LIMITATIONS: carrying, lifting, bending, standing, stairs, transfers, bathing, dressing, hygiene/grooming, locomotion level, and caring for others  PARTICIPATION LIMITATIONS: meal prep, cleaning, laundry, shopping, community activity, occupation, and yard work  PERSONAL FACTORS: Fitness, Time since onset of injury/illness/exacerbation, and 3+ comorbidities: DM, smokes, increased BMI, HTN  are also affecting patient's functional outcome.   REHAB POTENTIAL: Fair    CLINICAL DECISION MAKING: Unstable/unpredictable  EVALUATION COMPLEXITY: High  PLAN: PT FREQUENCY: 2x/week  PT DURATION: 4 weeks  PLANNED INTERVENTIONS: Therapeutic exercises,  Therapeutic activity, Neuromuscular re-education, Balance training, Gait training, Patient/Family education, Joint manipulation, Joint mobilization, Stair training, Orthotic/Fit training, DME instructions, Aquatic Therapy, Dry Needling, Electrical stimulation, Spinal manipulation, Spinal mobilization, Cryotherapy, Moist heat, Compression bandaging, scar mobilization, Splintting, Taping, Traction, Ultrasound, Ionotophoresis 4mg /ml Dexamethasone, and Manual therapy   PLAN FOR NEXT SESSION: debride and dressing changes PRN.  Measure and photograph weekly (Wednesdays).     Reola Mosher Shenicka Sunderlin, PT 05/31/2023, 11:07 AM

## 2023-06-06 ENCOUNTER — Encounter (HOSPITAL_COMMUNITY): Payer: Self-pay

## 2023-06-06 ENCOUNTER — Ambulatory Visit (HOSPITAL_COMMUNITY): Payer: Medicaid Other

## 2023-06-06 DIAGNOSIS — L02612 Cutaneous abscess of left foot: Secondary | ICD-10-CM

## 2023-06-06 DIAGNOSIS — R2689 Other abnormalities of gait and mobility: Secondary | ICD-10-CM | POA: Diagnosis not present

## 2023-06-06 NOTE — Therapy (Addendum)
OUTPATIENT PHYSICAL THERAPY Wound Treatment  Patient Name: Wyatt Taylor MRN: [id number] DOB:[birth year], 57 y.o., male Today's Date: 06/06/2023  PHYSICAL THERAPY DISCHARGE SUMMARY  Visits from Start of Care: 22  Current functional level related to goals / functional outcomes: Patient has met all goals with wound being healed.    Remaining deficits: N/a   Education / Equipment: N/a  Patient agrees to discharge. Patient goals were met. Patient is being discharged due to meeting the stated rehab goals.  10:20 AM, 06/06/23 Wyman Songster PT, DPT Physical Therapist at [facility]    PCP: No PCP REFERRING PROVIDER: Alvina Filbert MD  END OF SESSION:  PT End of Session - 06/06/23 0933     Visit Number 22    Number of Visits 24    Date for PT Re-Evaluation 06/12/23    Authorization Type Medicaid UHC community    Authorization Time Period no auth over 21, 30 visit limit    Authorization - Visit Number 21    Authorization - Number of Visits 30    Progress Note Due on Visit 26    PT Start Time 0902    PT Stop Time 0920    PT Time Calculation (min) 18 min    Activity Tolerance Patient tolerated treatment well    Behavior During Therapy WFL for tasks assessed/performed              Past Medical History:  Diagnosis Date   Chronic renal failure, stage 3a (HCC)    HTN (hypertension)    Type II diabetes mellitus with renal manifestations (HCC)    History reviewed. No pertinent surgical history. Patient Active Problem List   Diagnosis Date Noted   Acute pulmonary embolism (HCC) 04/24/2022   HTN (hypertension) 04/24/2022   Hypertensive urgency 04/24/2022   Chronic renal failure, stage 3a (HCC) 04/24/2022   Type II diabetes mellitus with renal manifestations (HCC) 04/24/2022   Elevated troponin 04/24/2022    ONSET DATE: 02/14/23  REFERRING DIAG: E11.10 (ICD-10-CM) - Type 2 diabetes mellitus with ketoacidosis without coma L02.612  (ICD-10-CM) - Cutaneous abscess of left foot  THERAPY DIAG:  Other abnormalities of gait and mobility  Abscess of left foot  Rationale for Evaluation and Treatment: Rehabilitation   PATIENT EDUCATION: Education details: Educated on dressing changes if soiled/drains through, POC, scope of PT Person educated: Patient and Spouse Education method: Explanation Education comprehension: verbalized understanding   HOME EXERCISE PROGRAM: N/a 05/19/23:  Ankle pumps Toe curls Elevation for edema ETI handout for edema control  Wound Therapy - 06/06/23 0001     Subjective Pt arrived with wife who changed dressings on Sunday.  Feel the wound has healed    Patient and Family Stated Goals wound to heal    Date of Onset 02/14/23    Prior Treatments antibiotics dressing changes    Pain Scale 0-10    Pain Score 0-No pain    Evaluation and Treatment Procedures Explained to Patient/Family Yes    Evaluation and Treatment Procedures agreed to    Wound Properties Date First Assessed: 03/21/23 Time First Assessed: 0740 Wound Type: Diabetic ulcer Location: Heel Location Orientation: Left Wound Description (Comments): L plantar foot wound Present on Admission: Yes   Wound Image Images linked: 1    Dressing Type Gauze (Comment);Moist to dry    Dressing Changed Changed    Dressing Status Old drainage    Dressing Change Frequency PRN    Site / Wound Assessment Granulation tissue    %  Wound base Red or Granulating 100%    Treatment Cleansed    Selective Debridement (non-excisional) - Location no debridement necessary    Wound Therapy - Clinical Statement see below    Wound Therapy - Functional Problem List ambulation    Factors Delaying/Impairing Wound Healing Diabetes Mellitus;Altered sensation;Infection - systemic/local;Immobility;Multiple medical problems;Tobacco use    Hydrotherapy Plan Dressing change;Debridement;Electrical stimulation;Patient/family education;Pulsatile lavage with  suction;Ultrasonic wound therapy @35  KHz (+/- 3 KHz)    Wound Therapy - Frequency 2X / week    Wound Therapy - Current Recommendations PT    Wound Plan DC to self care    Dressing  vaseline on foot                 GOALS: Goals reviewed with patient? Yes  SHORT TERM GOALS: Target date: 04/18/2023    Patient wound to decrease in size by 50% to demonstrate wound healing. Baseline: Goal status: MET   LONG TERM GOALS: Target date: 05/16/2023    Patients wound be healed to reduce risk of infection. Baseline:  Goal status: MET  2.  Patient will verbalize proper skin care techniques in order to reduce risk of further wound development.  Baseline:  Goal status: MET    ASSESSMENT:  CLINICAL IMPRESSION: Upon removal of dressings wound has fully healed.  Reviewed proper care and encouraged to keep moisturized as continues to present with callous and dryskin.  Pt and wife verbalized understanding.  Gauze placed over wound for comfort, plans to go home and put on socks/shoe.    OBJECTIVE IMPAIRMENTS: Abnormal gait, decreased activity tolerance, decreased balance, decreased endurance, decreased mobility, difficulty walking, decreased ROM, decreased strength, increased edema, impaired flexibility, impaired sensation, improper body mechanics, and pain.   ACTIVITY LIMITATIONS: carrying, lifting, bending, standing, stairs, transfers, bathing, dressing, hygiene/grooming, locomotion level, and caring for others  PARTICIPATION LIMITATIONS: meal prep, cleaning, laundry, shopping, community activity, occupation, and yard work  PERSONAL FACTORS: Fitness, Time since onset of injury/illness/exacerbation, and 3+ comorbidities: DM, smokes, increased BMI, HTN  are also affecting patient's functional outcome.   REHAB POTENTIAL: Fair    CLINICAL DECISION MAKING: Unstable/unpredictable  EVALUATION COMPLEXITY: High  PLAN: PT FREQUENCY: 2x/week  PT DURATION: 4 weeks  PLANNED INTERVENTIONS:  Therapeutic exercises, Therapeutic activity, Neuromuscular re-education, Balance training, Gait training, Patient/Family education, Joint manipulation, Joint mobilization, Stair training, Orthotic/Fit training, DME instructions, Aquatic Therapy, Dry Needling, Electrical stimulation, Spinal manipulation, Spinal mobilization, Cryotherapy, Moist heat, Compression bandaging, scar mobilization, Splintting, Taping, Traction, Ultrasound, Ionotophoresis 4mg /ml Dexamethasone, and Manual therapy   PLAN FOR NEXT SESSION: DC to self care    Becky Sax, LPTA/CLT; CBIS [phone]  Juel Burrow, PTA 06/06/2023, 9:34 AM

## 2023-06-07 ENCOUNTER — Ambulatory Visit (HOSPITAL_COMMUNITY): Payer: Medicaid Other | Admitting: Physical Therapy

## 2023-06-13 ENCOUNTER — Ambulatory Visit (HOSPITAL_COMMUNITY): Payer: Medicaid Other

## 2023-06-15 ENCOUNTER — Ambulatory Visit (HOSPITAL_COMMUNITY): Payer: Medicaid Other | Admitting: Physical Therapy

## 2023-12-15 ENCOUNTER — Other Ambulatory Visit: Payer: Self-pay

## 2023-12-15 ENCOUNTER — Emergency Department (HOSPITAL_COMMUNITY): Payer: Medicaid Other

## 2023-12-15 ENCOUNTER — Inpatient Hospital Stay (HOSPITAL_COMMUNITY)
Admission: EM | Admit: 2023-12-15 | Discharge: 2023-12-17 | DRG: 312 | Disposition: A | Discharge status: Home / Self Care | Payer: Medicaid Other | Attending: Internal Medicine | Admitting: Internal Medicine

## 2023-12-15 ENCOUNTER — Encounter (HOSPITAL_COMMUNITY): Payer: Self-pay

## 2023-12-15 DIAGNOSIS — R7989 Other specified abnormal findings of blood chemistry: Secondary | ICD-10-CM | POA: Diagnosis present

## 2023-12-15 DIAGNOSIS — R55 Syncope and collapse: Principal | ICD-10-CM | POA: Diagnosis present

## 2023-12-15 DIAGNOSIS — N185 Chronic kidney disease, stage 5: Secondary | ICD-10-CM | POA: Insufficient documentation

## 2023-12-15 DIAGNOSIS — N179 Acute kidney failure, unspecified: Secondary | ICD-10-CM | POA: Diagnosis not present

## 2023-12-15 DIAGNOSIS — I951 Orthostatic hypotension: Principal | ICD-10-CM | POA: Diagnosis present

## 2023-12-15 DIAGNOSIS — I132 Hypertensive heart and chronic kidney disease with heart failure and with stage 5 chronic kidney disease, or end stage renal disease: Secondary | ICD-10-CM | POA: Diagnosis present

## 2023-12-15 DIAGNOSIS — H5462 Unqualified visual loss, left eye, normal vision right eye: Secondary | ICD-10-CM | POA: Diagnosis present

## 2023-12-15 DIAGNOSIS — Z992 Dependence on renal dialysis: Secondary | ICD-10-CM

## 2023-12-15 DIAGNOSIS — F17210 Nicotine dependence, cigarettes, uncomplicated: Secondary | ICD-10-CM | POA: Diagnosis present

## 2023-12-15 DIAGNOSIS — E46 Unspecified protein-calorie malnutrition: Secondary | ICD-10-CM | POA: Insufficient documentation

## 2023-12-15 DIAGNOSIS — E785 Hyperlipidemia, unspecified: Secondary | ICD-10-CM | POA: Diagnosis present

## 2023-12-15 DIAGNOSIS — E1143 Type 2 diabetes mellitus with diabetic autonomic (poly)neuropathy: Secondary | ICD-10-CM | POA: Diagnosis present

## 2023-12-15 DIAGNOSIS — E8809 Other disorders of plasma-protein metabolism, not elsewhere classified: Secondary | ICD-10-CM | POA: Insufficient documentation

## 2023-12-15 DIAGNOSIS — N400 Enlarged prostate without lower urinary tract symptoms: Secondary | ICD-10-CM | POA: Diagnosis present

## 2023-12-15 DIAGNOSIS — N269 Renal sclerosis, unspecified: Secondary | ICD-10-CM | POA: Diagnosis present

## 2023-12-15 DIAGNOSIS — E875 Hyperkalemia: Secondary | ICD-10-CM | POA: Diagnosis not present

## 2023-12-15 DIAGNOSIS — I160 Hypertensive urgency: Secondary | ICD-10-CM | POA: Diagnosis present

## 2023-12-15 DIAGNOSIS — D631 Anemia in chronic kidney disease: Secondary | ICD-10-CM | POA: Diagnosis present

## 2023-12-15 DIAGNOSIS — Z7901 Long term (current) use of anticoagulants: Secondary | ICD-10-CM

## 2023-12-15 DIAGNOSIS — Z86711 Personal history of pulmonary embolism: Secondary | ICD-10-CM

## 2023-12-15 DIAGNOSIS — E1129 Type 2 diabetes mellitus with other diabetic kidney complication: Secondary | ICD-10-CM | POA: Diagnosis present

## 2023-12-15 DIAGNOSIS — N250 Renal osteodystrophy: Secondary | ICD-10-CM | POA: Diagnosis present

## 2023-12-15 DIAGNOSIS — E1151 Type 2 diabetes mellitus with diabetic peripheral angiopathy without gangrene: Secondary | ICD-10-CM | POA: Diagnosis present

## 2023-12-15 DIAGNOSIS — E1122 Type 2 diabetes mellitus with diabetic chronic kidney disease: Secondary | ICD-10-CM | POA: Diagnosis present

## 2023-12-15 DIAGNOSIS — I5032 Chronic diastolic (congestive) heart failure: Secondary | ICD-10-CM | POA: Diagnosis present

## 2023-12-15 DIAGNOSIS — Z79899 Other long term (current) drug therapy: Secondary | ICD-10-CM

## 2023-12-15 DIAGNOSIS — Z7984 Long term (current) use of oral hypoglycemic drugs: Secondary | ICD-10-CM

## 2023-12-15 HISTORY — DX: Blindness, one eye, unspecified eye: H54.40

## 2023-12-15 LAB — CBC WITH DIFFERENTIAL/PLATELET
Abs Immature Granulocytes: 0.02 10*3/uL (ref 0.00–0.07)
Basophils Absolute: 0 10*3/uL (ref 0.0–0.1)
Basophils Relative: 1 %
Eosinophils Absolute: 0.1 10*3/uL (ref 0.0–0.5)
Eosinophils Relative: 2 %
HCT: 24.7 % — ABNORMAL LOW (ref 39.0–52.0)
Hemoglobin: 7.7 g/dL — ABNORMAL LOW (ref 13.0–17.0)
Immature Granulocytes: 0 %
Lymphocytes Relative: 20 %
Lymphs Abs: 1.1 10*3/uL (ref 0.7–4.0)
MCH: 28 pg (ref 26.0–34.0)
MCHC: 31.2 g/dL (ref 30.0–36.0)
MCV: 89.8 fL (ref 80.0–100.0)
Monocytes Absolute: 0.3 10*3/uL (ref 0.1–1.0)
Monocytes Relative: 6 %
Neutro Abs: 3.9 10*3/uL (ref 1.7–7.7)
Neutrophils Relative %: 71 %
Platelets: 197 10*3/uL (ref 150–400)
RBC: 2.75 MIL/uL — ABNORMAL LOW (ref 4.22–5.81)
RDW: 16 % — ABNORMAL HIGH (ref 11.5–15.5)
WBC: 5.6 10*3/uL (ref 4.0–10.5)
nRBC: 0 % (ref 0.0–0.2)

## 2023-12-15 LAB — COMPREHENSIVE METABOLIC PANEL
ALT: 11 U/L (ref 0–44)
AST: 18 U/L (ref 15–41)
Albumin: 2.8 g/dL — ABNORMAL LOW (ref 3.5–5.0)
Alkaline Phosphatase: 50 U/L (ref 38–126)
Anion gap: 9 (ref 5–15)
BUN: 53 mg/dL — ABNORMAL HIGH (ref 6–20)
CO2: 21 mmol/L — ABNORMAL LOW (ref 22–32)
Calcium: 8 mg/dL — ABNORMAL LOW (ref 8.9–10.3)
Chloride: 106 mmol/L (ref 98–111)
Creatinine, Ser: 6.09 mg/dL — ABNORMAL HIGH (ref 0.61–1.24)
GFR, Estimated: 10 mL/min — ABNORMAL LOW (ref >60)
Glucose, Bld: 134 mg/dL — ABNORMAL HIGH (ref 70–99)
Potassium: 5.3 mmol/L — ABNORMAL HIGH (ref 3.5–5.1)
Sodium: 136 mmol/L (ref 135–145)
Total Bilirubin: 0.6 mg/dL (ref 0.0–1.2)
Total Protein: 7.2 g/dL (ref 6.5–8.1)

## 2023-12-15 LAB — TROPONIN I (HIGH SENSITIVITY)
Troponin I (High Sensitivity): 24 ng/L — ABNORMAL HIGH (ref <18)
Troponin I (High Sensitivity): 24 ng/L — ABNORMAL HIGH (ref <18)

## 2023-12-15 LAB — GLUCOSE, CAPILLARY: Glucose-Capillary: 112 mg/dL — ABNORMAL HIGH (ref 70–99)

## 2023-12-15 MED ORDER — APIXABAN 5 MG PO TABS
5.0000 mg | ORAL_TABLET | Freq: Two times a day (BID) | ORAL | Status: DC
  Administered 2023-12-15 – 2023-12-17 (×4): 5 mg via ORAL
  Filled 2023-12-15 (×4): qty 1

## 2023-12-15 MED ORDER — CARVEDILOL 12.5 MG PO TABS
25.0000 mg | ORAL_TABLET | Freq: Two times a day (BID) | ORAL | Status: DC
Start: 2023-12-16 — End: 2023-12-17
  Administered 2023-12-16 – 2023-12-17 (×3): 25 mg via ORAL
  Filled 2023-12-15 (×3): qty 2

## 2023-12-15 MED ORDER — ACETAMINOPHEN 650 MG RE SUPP: 650.0000 mg | Freq: Four times a day (QID) | RECTAL | Status: DC | PRN

## 2023-12-15 MED ORDER — ONDANSETRON HCL 4 MG/2ML IJ SOLN: 4.0000 mg | Freq: Four times a day (QID) | INTRAMUSCULAR | Status: DC | PRN

## 2023-12-15 MED ORDER — LOSARTAN POTASSIUM 25 MG PO TABS: 25.0000 mg | ORAL_TABLET | Freq: Every day | ORAL | Status: DC

## 2023-12-15 MED ORDER — HYDRALAZINE HCL 20 MG/ML IJ SOLN
10.0000 mg | Freq: Four times a day (QID) | INTRAMUSCULAR | Status: DC | PRN
  Administered 2023-12-15: 10 mg via INTRAVENOUS
  Filled 2023-12-15: qty 1

## 2023-12-15 MED ORDER — GLUCERNA SHAKE PO LIQD
237.0000 mL | Freq: Three times a day (TID) | ORAL | Status: DC
  Administered 2023-12-15 – 2023-12-16 (×4): 237 mL via ORAL
  Filled 2023-12-15 (×3): qty 237

## 2023-12-15 MED ORDER — ONDANSETRON HCL 4 MG PO TABS: 4.0000 mg | ORAL_TABLET | Freq: Four times a day (QID) | ORAL | Status: DC | PRN

## 2023-12-15 MED ORDER — HEPARIN SODIUM (PORCINE) 5000 UNIT/ML IJ SOLN: 5000.0000 [IU] | Freq: Three times a day (TID) | INTRAMUSCULAR | Status: DC

## 2023-12-15 MED ORDER — SODIUM CHLORIDE 0.9 % IV BOLUS
500.0000 mL | Freq: Once | INTRAVENOUS | Status: AC
  Administered 2023-12-15: 500 mL via INTRAVENOUS

## 2023-12-15 MED ORDER — ACETAMINOPHEN 325 MG PO TABS
650.0000 mg | ORAL_TABLET | Freq: Four times a day (QID) | ORAL | Status: DC | PRN
Start: 2023-12-15 — End: 2023-12-17

## 2023-12-15 MED ORDER — INSULIN ASPART 100 UNIT/ML IJ SOLN
0.0000 [IU] | Freq: Three times a day (TID) | INTRAMUSCULAR | Status: DC
  Administered 2023-12-16 – 2023-12-17 (×3): 1 [IU] via SUBCUTANEOUS
  Administered 2023-12-17: 2 [IU] via SUBCUTANEOUS

## 2023-12-15 MED ORDER — AMLODIPINE BESYLATE 5 MG PO TABS: 10.0000 mg | ORAL_TABLET | Freq: Every day | ORAL | Status: DC

## 2023-12-15 NOTE — ED Notes (Signed)
Pt has girlfriend at bedside.

## 2023-12-15 NOTE — ED Triage Notes (Addendum)
Patient BIB [facility] from a restaurant, dispatched for unconscious patient. Patient vomited, Hx of diabetes CBG 153. Patient's family stated that patient had a seizure, EMS stated no active seizing noted but patient was starting off into space. Patient having delayed responses. Patient complaint of shortness of breath that just started today.

## 2023-12-15 NOTE — H&P (Addendum)
History and Physical    Patient: Wyatt Taylor ZOX:[id number] DOB: 07-30-1966 DOA: 12/15/2023 DOS: the patient was seen and examined on 12/15/2023 PCP: [facility]  Patient coming from: Home  Chief Complaint:  Chief Complaint  Patient presents with   Shortness of Breath   Seizures   HPI: Wyatt Taylor is a 58 y.o. male with medical history significant of hypertension, type 2 diabetes mellitus, CKD who presented to the emergency department via EMS due to passing out.  Patient was out in a restaurant with his girlfriend this evening when he suddenly started to stare at his food, this was followed with vomiting and he eventually passed out for a few minutes.  He regained consciousness 2 to 3 minutes later, though he was still confused for a few minutes prior to returning to baseline of functioning.  EMS was activated and patient was taken to the ED for further evaluation and management.   ED Course:  In the emergency department, BP was 191/91, other vital signs were within normal range.  Workup in the ED showed normocytic anemia.  BMP showed sodium of 136, potassium 5.3, chloride 106, bicarb 21, blood glucose 134, BUN 53, creatinine 6.09, calcium 8.0, albumin 2.8, EGFR 10.  Troponin x 2 was flat at 24 CT head without contrast showed no evidence of acute acute abnormality Chest x-ray showed no active disease IV NS 500 mL was provided.  Hospitalist was asked to admit patient for further evaluation and management.  Review of Systems: Review of systems as noted in the HPI. All other systems reviewed and are negative.   Past Medical History:  Diagnosis Date   Chronic renal failure, stage 3a (HCC)    HTN (hypertension)    Type II diabetes mellitus with renal manifestations (HCC)    History reviewed. No pertinent surgical history.  Social History:  reports that he has been smoking cigarettes. He has never used smokeless tobacco. He reports that he does not currently use  alcohol. He reports that he does not use drugs.   No Known Allergies  Family History  Problem Relation Age of Onset   Deep vein thrombosis Daughter    Pulmonary embolism Daughter      Prior to Admission medications   Medication Sig Start Date End Date Taking? Authorizing Provider  amLODipine (NORVASC) 5 MG tablet Take 10 mg by mouth daily.    [provider]  apixaban (ELIQUIS) 5 MG TABS tablet Take 1 tablet (5 mg total) by mouth 2 (two) times daily. Start AFTER completing starter pack 05/25/22   Wouk, Wilfred Curtis, MD  APIXABAN Everlene Balls) VTE STARTER PACK (10MG  AND 5MG ) Take as directed on package: start with two-5mg  tablets twice daily for 7 days. On day 8, switch to one-5mg  tablet twice daily. 04/25/22   Wouk, Wilfred Curtis, MD  glipiZIDE (GLUCOTROL) 10 MG tablet Take 10 mg by mouth 2 (two) times daily before a meal.    [provider]  losartan (COZAAR) 25 MG tablet Take 25 mg by mouth daily.    [provider]  metFORMIN (GLUCOPHAGE) 1000 MG tablet Take 1,000 mg by mouth 2 (two) times daily with a meal.    [provider]    Physical Exam: BP (!) 187/81   Pulse 75   Temp 98.8 F (37.1 C) (Oral)   Resp 19   Ht 5\' 10"  (1.778 m)   Wt 81.6 kg   SpO2 99%   BMI 25.83 kg/m   General: 58 y.o.  year-old male well developed well nourished in no acute distress.  Alert and oriented x3. HEENT: NCAT, EOMI Neck: Supple, trachea medial Cardiovascular: Regular rate and rhythm with no rubs or gallops.  No thyromegaly or JVD noted.  No lower extremity edema. 2/4 pulses in all 4 extremities. Respiratory: Clear to auscultation with no wheezes or rales. Good inspiratory effort. Abdomen: Soft, nontender nondistended with normal bowel sounds x4 quadrants. Muskuloskeletal: No cyanosis, clubbing or edema noted bilaterally Neuro: CN II-XII intact, strength 5/5 x 4, sensation, reflexes intact Skin: No ulcerative lesions noted or rashes Psychiatry: Judgement and insight  appear normal. Mood is appropriate for condition and setting          Labs on Admission:  Basic Metabolic Panel: Recent Labs  Lab 12/15/23 1548  NA 136  K 5.3*  CL 106  CO2 21*  GLUCOSE 134*  BUN 53*  CREATININE 6.09*  CALCIUM 8.0*   Liver Function Tests: Recent Labs  Lab 12/15/23 1548  AST 18  ALT 11  ALKPHOS 50  BILITOT 0.6  PROT 7.2  ALBUMIN 2.8*   No results for input(s): "LIPASE", "AMYLASE" in the last 168 hours. No results for input(s): "AMMONIA" in the last 168 hours. CBC: Recent Labs  Lab 12/15/23 1548  WBC 5.6  NEUTROABS 3.9  HGB 7.7*  HCT 24.7*  MCV 89.8  PLT 197   Cardiac Enzymes: No results for input(s): "CKTOTAL", "CKMB", "CKMBINDEX", "TROPONINI" in the last 168 hours.  BNP (last 3 results) No results for input(s): "BNP" in the last 8760 hours.  ProBNP (last 3 results) No results for input(s): "PROBNP" in the last 8760 hours.  CBG: No results for input(s): "GLUCAP" in the last 168 hours.  Radiological Exams on Admission: CT Head Wo Contrast Result Date: 12/15/2023 CLINICAL DATA:  Syncope/presyncope, cerebrovascular cause suspected EXAM: CT HEAD WITHOUT CONTRAST TECHNIQUE: Contiguous axial images were obtained from the base of the skull through the vertex without intravenous contrast. RADIATION DOSE REDUCTION: This exam was performed according to the departmental dose-optimization program which includes automated exposure control, adjustment of the mA and/or kV according to patient size and/or use of iterative reconstruction technique. COMPARISON:  None Available. 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 No evidence of acute large vascular territory infarct, acute hemorrhage, mass lesion or midline shift. No hydrocephalus. Vascular: No hyperdense vessel identified. Calcific atherosclerosis. Skull: No acute fracture. Sinuses/Orbits: Clear sinuses.  No acute orbital findings. Other: No mastoid  effusions. IMPRESSION: 1. No evidence of acute intracranial abnormality. 2. 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 Electronically Signed   By: Feliberto Harts M.D.   On: 12/15/2023 16:45   DG Chest Port 1 View Result Date: 12/15/2023 CLINICAL DATA:  Shortness of breath EXAM: PORTABLE CHEST 1 VIEW COMPARISON:  04/24/2022 FINDINGS: Low lung volume. Borderline cardiac enlargement. No acute airspace disease or effusion. No pneumothorax IMPRESSION: Low lung volume. No active disease. Electronically Signed   By: Jasmine Pang M.D.   On: 12/15/2023 16:18    EKG: I independently viewed the EKG done and my findings are as followed: Normal sinus rhythm at rate of 62 beats per minutes with borderline prolonged PR interval  Assessment/Plan Present on Admission:  Syncope  Elevated troponin  Hypertensive urgency  Type II diabetes mellitus with renal manifestations (HCC)  Principal Problem:   Syncope Active Problems:   Hypertensive urgency   Type II diabetes mellitus with renal manifestations (HCC)  Elevated troponin   Acute kidney injury superimposed on stage 5 chronic kidney disease, not on chronic dialysis (HCC)   Hyperkalemia   Hypoalbuminemia due to protein-calorie malnutrition (HCC)  Syncope Continue telemetry and watch for arrhythmias Troponins x 2 was flat at 24  EKG showed normal sinus rhythm at a rate of 62 bpm with borderline prolonged PR interval Echocardiogram done on 04/24/2022 showed LVEF of 60 to 65%.  No RWMA.  Severe concentric LVH.  G1 DD Echocardiogram will be done to rule out significant aortic stenosis or other outflow obstruction, and also to evaluate EF and to rule out segmental/Regional wall motion abnormalities.  Carotid artery Dopplers will be done to rule out hemodynamically significant stenosis EEG will be done to rule out seizures  AKI on CKD stage V BUN/Creatinine 53/6.09 Nephrologist was consulted EDP and recommended IV  hydration with plan to see patient in the morning Renally adjust medications, avoid nephrotoxic agents/dehydration/hypotension  Hyperkalemia K+ 5.3, continue IV hydration Potassium level will be rechecked after hydration  Hypertensive urgency Essential hypertension Continue IV hydralazine 10 mg every 6 hours as needed for SBP > 170 Continue Coreg  Hypoalbuminemia Albumin 2.8, protein supplement will be provided  Type 2 diabetes mellitus Hemoglobin A1c on 11/06/2023 was 6.4 Continue ISS and hypoglycemia protocol Glipizide and metformin will be held at this time  Pulmonary embolism Continue Eliquis  DVT prophylaxis: Eliquis  Code Status: Full code  Family Communication: Girlfriend at bedside (all questions answered to satisfaction)  Consults: Nephrology  Severity of Illness: The appropriate patient status for this patient is OBSERVATION. Observation status is judged to be reasonable and necessary in order to provide the required intensity of service to ensure the patient's safety. The patient's presenting symptoms, physical exam findings, and initial radiographic and laboratory data in the context of their medical condition is felt to place them at decreased risk for further clinical deterioration. Furthermore, it is anticipated that the patient will be medically stable for discharge from the hospital within 2 midnights of admission.   Author: Frankey Shown, DO 12/15/2023 8:49 PM  For on call review [email or web address].

## 2023-12-15 NOTE — ED Notes (Signed)
Patient declined assistance to remove wet clothing. Patient girlfriend at bedside. Another warm blanket given to patient. Patient girlfriend informed this nurse patient like to cover his head when cold. Offered again to assist a patient out of wet clothing and into a gown with patient refusal.

## 2023-12-15 NOTE — ED Provider Notes (Signed)
[facility] EM[facility] DE[facility]MENT AT [facility] Provider Note   CSN: [id number] Arrival date & time: 12/15/23  1408     History {Add pertinent medical, surgical, social history, OB history to HPI:1} Chief Complaint  Patient presents with   Shortness of Breath   Seizures    Wyatt Taylor is a 58 y.o. male.  Patient has a history of diabetes, hypertension, kidney disease.  He was at a restaurant in his significant other stated he was staring at his food for a short while and then threw up and passed out.   Shortness of Breath Seizures      Home Medications Prior to Admission medications   Medication Sig Start Date End Date Taking? Authorizing Provider  amLODipine (NORVASC) 5 MG tablet Take 10 mg by mouth daily.    [provider]  apixaban (ELIQUIS) 5 MG TABS tablet Take 1 tablet (5 mg total) by mouth 2 (two) times daily. Start AFTER completing starter pack 05/25/22   Wouk, Wilfred Curtis, MD  APIXABAN Everlene Balls) VTE STARTER PACK (10MG  AND 5MG ) Take as directed on package: start with two-5mg  tablets twice daily for 7 days. On day 8, switch to one-5mg  tablet twice daily. 04/25/22   Wouk, Wilfred Curtis, MD  glipiZIDE (GLUCOTROL) 10 MG tablet Take 10 mg by mouth 2 (two) times daily before a meal.    [provider]  losartan (COZAAR) 25 MG tablet Take 25 mg by mouth daily.    [provider]  metFORMIN (GLUCOPHAGE) 1000 MG tablet Take 1,000 mg by mouth 2 (two) times daily with a meal.    [provider]      Allergies    Patient has no known allergies.    Review of Systems   Review of Systems  Respiratory:  Positive for shortness of breath.   Neurological:  Positive for seizures.    Physical Exam Updated Vital Signs BP (!) 174/75   Pulse 72   Temp 98.6 F (37 C) (Oral)   Resp 16   Ht 5\' 10"  (1.778 m)   Wt 81.6 kg   SpO2 100%   BMI 25.83 kg/m  Physical Exam  ED Results / Procedures / Treatments   Labs (all labs ordered  are listed, but only abnormal results are displayed) Labs Reviewed  CBC WITH DIFFERENTIAL/PLATELET - Abnormal; Notable for the following components:      Result Value   RBC 2.75 (*)    Hemoglobin 7.7 (*)    HCT 24.7 (*)    RDW 16.0 (*)    All other components within normal limits  COMPREHENSIVE METABOLIC PANEL - Abnormal; Notable for the following components:   Potassium 5.3 (*)    CO2 21 (*)    Glucose, Bld 134 (*)    BUN 53 (*)    Creatinine, Ser 6.09 (*)    Calcium 8.0 (*)    Albumin 2.8 (*)    GFR, Estimated 10 (*)    All other components within normal limits  TROPONIN I (HIGH SENSITIVITY) - Abnormal; Notable for the following components:   Troponin I (High Sensitivity) 24 (*)    All other components within normal limits  TROPONIN I (HIGH SENSITIVITY) - Abnormal; Notable for the following components:   Troponin I (High Sensitivity) 24 (*)    All other components within normal limits    EKG None  Radiology CT Head Wo Contrast Result Date: 12/15/2023 CLINICAL DATA:  Syncope/presyncope, cerebrovascular cause suspected EXAM: CT HEAD WITHOUT CONTRAST TECHNIQUE:  Contiguous axial images were obtained from the base of the skull through the vertex without intravenous contrast. RADIATION DOSE REDUCTION: This exam was performed according to the departmental dose-optimization program which includes automated exposure control, adjustment of the mA and/or kV according to patient size and/or use of iterative reconstruction technique. COMPARISON:  None Available. 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 No evidence of acute large vascular territory infarct, acute hemorrhage, mass lesion or midline shift. No hydrocephalus. Vascular: No hyperdense vessel identified. Calcific atherosclerosis. Skull: No acute fracture. Sinuses/Orbits: Clear sinuses.  No acute orbital findings. Other: No mastoid effusions. IMPRESSION: 1. No evidence of acute  intracranial abnormality. 2. 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 Electronically Signed   By: Feliberto Harts M.D.   On: 12/15/2023 16:45   DG Chest Port 1 View Result Date: 12/15/2023 CLINICAL DATA:  Shortness of breath EXAM: PORTABLE CHEST 1 VIEW COMPARISON:  04/24/2022 FINDINGS: Low lung volume. Borderline cardiac enlargement. No acute airspace disease or effusion. No pneumothorax IMPRESSION: Low lung volume. No active disease. Electronically Signed   By: Jasmine Pang M.D.   On: 12/15/2023 16:18    Procedures Procedures  {Document cardiac monitor, telemetry assessment procedure when appropriate:1}  Medications Ordered in ED Medications  sodium chloride 0.9 % bolus 500 mL (0 mLs Intravenous Stopped 12/15/23 1802)    ED Course/ Medical Decision Making/ A&P  Patient with significant renal failure.  I spoke to nephrology and they recommended hydration and nephrology will consult on the patient tomorrow in the hospital {   Click here for ABCD2, HEART and other calculatorsREFRESH Note before signing :1}                              Medical Decision Making Amount and/or Complexity of Data Reviewed Labs: ordered. Radiology: ordered. ECG/medicine tests: ordered.  Risk Decision regarding hospitalization.   Syncope and renal failure  {Document critical care time when appropriate:1} {Document review of labs and clinical decision tools ie heart score, Chads2Vasc2 etc:1}  {Document your independent review of radiology images, and any outside records:1} {Document your discussion with family members, caretakers, and with consultants:1} {Document social determinants of health affecting pt's care:1} {Document your decision making why or why not admission, treatments were needed:1} Final Clinical Impression(s) / ED Diagnoses Final diagnoses:  Syncope and collapse    Rx / DC Orders ED Discharge Orders     None

## 2023-12-16 ENCOUNTER — Observation Stay (HOSPITAL_COMMUNITY): Payer: Medicaid Other

## 2023-12-16 DIAGNOSIS — Z7901 Long term (current) use of anticoagulants: Secondary | ICD-10-CM | POA: Diagnosis not present

## 2023-12-16 DIAGNOSIS — E8809 Other disorders of plasma-protein metabolism, not elsewhere classified: Secondary | ICD-10-CM | POA: Diagnosis present

## 2023-12-16 DIAGNOSIS — I5032 Chronic diastolic (congestive) heart failure: Secondary | ICD-10-CM | POA: Diagnosis present

## 2023-12-16 DIAGNOSIS — Z79899 Other long term (current) drug therapy: Secondary | ICD-10-CM | POA: Diagnosis not present

## 2023-12-16 DIAGNOSIS — E785 Hyperlipidemia, unspecified: Secondary | ICD-10-CM | POA: Diagnosis present

## 2023-12-16 DIAGNOSIS — E1151 Type 2 diabetes mellitus with diabetic peripheral angiopathy without gangrene: Secondary | ICD-10-CM | POA: Diagnosis present

## 2023-12-16 DIAGNOSIS — Z86711 Personal history of pulmonary embolism: Secondary | ICD-10-CM | POA: Diagnosis not present

## 2023-12-16 DIAGNOSIS — R55 Syncope and collapse: Secondary | ICD-10-CM

## 2023-12-16 DIAGNOSIS — E1143 Type 2 diabetes mellitus with diabetic autonomic (poly)neuropathy: Secondary | ICD-10-CM | POA: Diagnosis present

## 2023-12-16 DIAGNOSIS — I132 Hypertensive heart and chronic kidney disease with heart failure and with stage 5 chronic kidney disease, or end stage renal disease: Secondary | ICD-10-CM | POA: Diagnosis present

## 2023-12-16 DIAGNOSIS — F17210 Nicotine dependence, cigarettes, uncomplicated: Secondary | ICD-10-CM | POA: Diagnosis present

## 2023-12-16 DIAGNOSIS — N269 Renal sclerosis, unspecified: Secondary | ICD-10-CM | POA: Diagnosis present

## 2023-12-16 DIAGNOSIS — E1122 Type 2 diabetes mellitus with diabetic chronic kidney disease: Secondary | ICD-10-CM | POA: Diagnosis present

## 2023-12-16 DIAGNOSIS — I951 Orthostatic hypotension: Secondary | ICD-10-CM | POA: Diagnosis present

## 2023-12-16 DIAGNOSIS — D631 Anemia in chronic kidney disease: Secondary | ICD-10-CM | POA: Diagnosis present

## 2023-12-16 DIAGNOSIS — Z7984 Long term (current) use of oral hypoglycemic drugs: Secondary | ICD-10-CM | POA: Diagnosis not present

## 2023-12-16 DIAGNOSIS — N185 Chronic kidney disease, stage 5: Secondary | ICD-10-CM | POA: Diagnosis present

## 2023-12-16 DIAGNOSIS — I160 Hypertensive urgency: Secondary | ICD-10-CM | POA: Diagnosis present

## 2023-12-16 DIAGNOSIS — N179 Acute kidney failure, unspecified: Secondary | ICD-10-CM | POA: Diagnosis present

## 2023-12-16 DIAGNOSIS — E875 Hyperkalemia: Secondary | ICD-10-CM | POA: Diagnosis present

## 2023-12-16 DIAGNOSIS — N400 Enlarged prostate without lower urinary tract symptoms: Secondary | ICD-10-CM | POA: Diagnosis present

## 2023-12-16 DIAGNOSIS — N250 Renal osteodystrophy: Secondary | ICD-10-CM | POA: Diagnosis present

## 2023-12-16 DIAGNOSIS — H5462 Unqualified visual loss, left eye, normal vision right eye: Secondary | ICD-10-CM | POA: Diagnosis present

## 2023-12-16 DIAGNOSIS — R7989 Other specified abnormal findings of blood chemistry: Secondary | ICD-10-CM | POA: Diagnosis not present

## 2023-12-16 LAB — ECHOCARDIOGRAM COMPLETE
AR max vel: 2.32 cm2
AV Area VTI: 2.08 cm2
AV Area mean vel: 2.07 cm2
AV Mean grad: 6 mm[Hg]
AV Peak grad: 10.4 mm[Hg]
Ao pk vel: 1.61 m/s
Area-P 1/2: 3.46 cm2
Height: 70 in
S' Lateral: 3.3 cm
Weight: 2880 [oz_av]

## 2023-12-16 LAB — URINALYSIS, W/ REFLEX TO CULTURE (INFECTION SUSPECTED)
Bacteria, UA: NONE SEEN
Bilirubin Urine: NEGATIVE
Glucose, UA: 50 mg/dL — AB
Hgb urine dipstick: NEGATIVE
Ketones, ur: NEGATIVE mg/dL
Leukocytes,Ua: NEGATIVE
Nitrite: NEGATIVE
Protein, ur: >=300 mg/dL — AB
Specific Gravity, Urine: 1.013 (ref 1.005–1.030)
pH: 5 (ref 5.0–8.0)

## 2023-12-16 LAB — RAPID URINE DRUG SCREEN, HOSP PERFORMED
Amphetamines: NOT DETECTED
Barbiturates: NOT DETECTED
Benzodiazepines: NOT DETECTED
Cocaine: NOT DETECTED
Opiates: NOT DETECTED
Tetrahydrocannabinol: NOT DETECTED

## 2023-12-16 LAB — IRON AND TIBC
Iron: 23 ug/dL — ABNORMAL LOW (ref 45–182)
Saturation Ratios: 12 % — ABNORMAL LOW (ref 17.9–39.5)
TIBC: 195 ug/dL — ABNORMAL LOW (ref 250–450)
UIBC: 172 ug/dL

## 2023-12-16 LAB — COMPREHENSIVE METABOLIC PANEL
ALT: 9 U/L (ref 0–44)
AST: 12 U/L — ABNORMAL LOW (ref 15–41)
Albumin: 2.5 g/dL — ABNORMAL LOW (ref 3.5–5.0)
Alkaline Phosphatase: 48 U/L (ref 38–126)
Anion gap: 7 (ref 5–15)
BUN: 54 mg/dL — ABNORMAL HIGH (ref 6–20)
CO2: 20 mmol/L — ABNORMAL LOW (ref 22–32)
Calcium: 7.7 mg/dL — ABNORMAL LOW (ref 8.9–10.3)
Chloride: 111 mmol/L (ref 98–111)
Creatinine, Ser: 5.97 mg/dL — ABNORMAL HIGH (ref 0.61–1.24)
GFR, Estimated: 10 mL/min — ABNORMAL LOW (ref >60)
Glucose, Bld: 104 mg/dL — ABNORMAL HIGH (ref 70–99)
Potassium: 4.9 mmol/L (ref 3.5–5.1)
Sodium: 138 mmol/L (ref 135–145)
Total Bilirubin: 0.4 mg/dL (ref 0.0–1.2)
Total Protein: 6.2 g/dL — ABNORMAL LOW (ref 6.5–8.1)

## 2023-12-16 LAB — CBC
HCT: 21.7 % — ABNORMAL LOW (ref 39.0–52.0)
Hemoglobin: 6.7 g/dL — CL (ref 13.0–17.0)
MCH: 27.7 pg (ref 26.0–34.0)
MCHC: 30.9 g/dL (ref 30.0–36.0)
MCV: 89.7 fL (ref 80.0–100.0)
Platelets: 188 10*3/uL (ref 150–400)
RBC: 2.42 MIL/uL — ABNORMAL LOW (ref 4.22–5.81)
RDW: 15.8 % — ABNORMAL HIGH (ref 11.5–15.5)
WBC: 13.9 10*3/uL — ABNORMAL HIGH (ref 4.0–10.5)
nRBC: 0 % (ref 0.0–0.2)

## 2023-12-16 LAB — PROCALCITONIN: Procalcitonin: 0.14 ng/mL

## 2023-12-16 LAB — FOLATE: Folate: 6.5 ng/mL (ref >5.9)

## 2023-12-16 LAB — VITAMIN B12: Vitamin B-12: 298 pg/mL (ref 180–914)

## 2023-12-16 LAB — PHOSPHORUS
Phosphorus: 5.2 mg/dL — ABNORMAL HIGH (ref 2.5–4.6)
Phosphorus: 5.1 mg/dL — ABNORMAL HIGH (ref 2.5–4.6)

## 2023-12-16 LAB — ABO/RH: ABO/RH(D): A POS

## 2023-12-16 LAB — MAGNESIUM: Magnesium: 1.8 mg/dL (ref 1.7–2.4)

## 2023-12-16 LAB — HEMOGLOBIN A1C
Hgb A1c MFr Bld: 5.9 % — ABNORMAL HIGH (ref 4.8–5.6)
Mean Plasma Glucose: 122.63 mg/dL

## 2023-12-16 LAB — GLUCOSE, CAPILLARY
Glucose-Capillary: 102 mg/dL — ABNORMAL HIGH (ref 70–99)
Glucose-Capillary: 132 mg/dL — ABNORMAL HIGH (ref 70–99)
Glucose-Capillary: 142 mg/dL — ABNORMAL HIGH (ref 70–99)
Glucose-Capillary: 137 mg/dL — ABNORMAL HIGH (ref 70–99)

## 2023-12-16 LAB — CK: Total CK: 199 U/L (ref 49–397)

## 2023-12-16 LAB — PREPARE RBC (CROSSMATCH)

## 2023-12-16 LAB — HIV ANTIBODY (ROUTINE TESTING W REFLEX): HIV Screen 4th Generation wRfx: NONREACTIVE

## 2023-12-16 LAB — FERRITIN: Ferritin: 129 ng/mL (ref 24–336)

## 2023-12-16 LAB — T4, FREE: Free T4: 0.67 ng/dL (ref 0.61–1.12)

## 2023-12-16 LAB — TSH: TSH: 12.506 u[IU]/mL — ABNORMAL HIGH (ref 0.350–4.500)

## 2023-12-16 MED ORDER — SODIUM CHLORIDE 0.9 % IV SOLN: INTRAVENOUS | Status: AC

## 2023-12-16 MED ORDER — SODIUM CHLORIDE 0.9% IV SOLUTION: Freq: Once | INTRAVENOUS | Status: AC

## 2023-12-16 NOTE — Plan of Care (Signed)
  Problem: Education: Goal: Knowledge of General Education information will improve Description: Including pain rating scale, medication(s)/side effects and non-pharmacologic comfort measures Outcome: Progressing   Problem: Health Behavior/Discharge Planning: Goal: Ability to manage health-related needs will improve Outcome: Progressing   Problem: Clinical Measurements: Goal: Ability to maintain clinical measurements within normal limits will improve Outcome: Progressing Goal: Will remain free from infection Outcome: Progressing Goal: Diagnostic test results will improve Outcome: Progressing Goal: Respiratory complications will improve Outcome: Progressing Goal: Cardiovascular complication will be avoided Outcome: Progressing   Problem: Activity: Goal: Risk for activity intolerance will decrease Outcome: Progressing   Problem: Nutrition: Goal: Adequate nutrition will be maintained Outcome: Progressing   Problem: Coping: Goal: Level of anxiety will decrease Outcome: Progressing   Problem: Elimination: Goal: Will not experience complications related to bowel motility Outcome: Progressing Goal: Will not experience complications related to urinary retention Outcome: Progressing   Problem: Pain Managment: Goal: General experience of comfort will improve and/or be controlled Outcome: Progressing   Problem: Safety: Goal: Ability to remain free from injury will improve Outcome: Progressing   Problem: Skin Integrity: Goal: Risk for impaired skin integrity will decrease Outcome: Progressing   Problem: Education: Goal: Ability to describe self-care measures that may prevent or decrease complications (Diabetes Survival Skills Education) will improve Outcome: Progressing Goal: Individualized Educational Video(s) Outcome: Progressing   Problem: Coping: Goal: Ability to adjust to condition or change in health will improve Outcome: Progressing   Problem: Fluid  Volume: Goal: Ability to maintain a balanced intake and output will improve Outcome: Progressing   Problem: Health Behavior/Discharge Planning: Goal: Ability to identify and utilize available resources and services will improve Outcome: Progressing Goal: Ability to manage health-related needs will improve Outcome: Progressing   Problem: Metabolic: Goal: Ability to maintain appropriate glucose levels will improve Outcome: Progressing   Problem: Nutritional: Goal: Maintenance of adequate nutrition will improve Outcome: Progressing Goal: Progress toward achieving an optimal weight will improve Outcome: Progressing   Problem: Skin Integrity: Goal: Risk for impaired skin integrity will decrease Outcome: Progressing   Problem: Tissue Perfusion: Goal: Adequacy of tissue perfusion will improve Outcome: Progressing   Problem: Education: Goal: Knowledge of condition and prescribed therapy will improve Outcome: Progressing   Problem: Cardiac: Goal: Will achieve and/or maintain adequate cardiac output Outcome: Progressing   Problem: Physical Regulation: Goal: Complications related to the disease process, condition or treatment will be avoided or minimized Outcome: Progressing

## 2023-12-16 NOTE — Progress Notes (Signed)
  Echocardiogram 2D Echocardiogram has been performed.  Reinaldo Raddle Augie Vane 12/16/2023, 9:58 AM

## 2023-12-16 NOTE — Progress Notes (Signed)
   12/16/23 0804  TOC Brief Assessment  Insurance and Status Reviewed  Patient has primary care physician Yes  Home environment has been reviewed From Home  Prior level of function: Independent  Prior/Current Home Services No current home services  Social Drivers of Health Review SDOH reviewed no interventions necessary  Readmission risk has been reviewed Yes  Transition of care needs no transition of care needs at this time      Transition of Care Department [facility]) has reviewed patient and no TOC needs have been identified at this time. We will continue to monitor patient advancement through interdisciplinary progression rounds. If new patient transition needs arise, please place a TOC consult.

## 2023-12-16 NOTE — Progress Notes (Addendum)
PROGRESS NOTE  Wyatt Taylor ZOX:[id number] DOB: 01/22/1966 DOA: 12/15/2023 PCP: [name]  Brief History:  58 year old male with a history of CKD stage V, diabetes mellitus type 2, hypertension, PE 04/2022, cigar smoking, anemia of CKD and hyperlipidemia presenting with an episode of unresponsiveness.  The patient was at a restaurant sitting at the table when his fiance noted the patient staring off into space.  She asked him if he was feeling okay, and the patient responded, " I do not feel good".  After his response, the patient had an episode of emesis after which he slowly hunched over at the table.  Subsequently a local bystander helped to get the patient off the table and lowered him onto his side on the ground.  After about 2 to 3 minutes, the patient was awake and following commands although he was confused.  There was no bowel or bladder incontinence.  There was no tongue bite.  He has never had a similar episode.  He denies any new medications.  He denies any illicit drugs. Prior to this episode, the patient had been in his usual state of health without any complaints.  Patient denies fevers, chills, headache, chest pain, dyspnea, nausea, vomiting, diarrhea, abdominal pain, dysuria, hematuria, hematochezia, and melena. In the ED, the patient was afebrile and hemodynamically stable with oxygen saturation 94% on room air.  WBC 5.6, hemoglobin 7.7, platelets 197.  Sodium 136, potassium 5.3, bicarbonate 21, serum creatinine 6.09.  LFTs were unremarkable.  CT of the brain was negative for any acute findings.  The patient was admitted for further evaluation and treatment of his episode of unresponsiveness and worsening serum creatinine. Chest x-ray was negative for any acute findings.  EKG showed sinus rhythm with nonspecific T wave changes.  Review of the medical record discussed as below.  Patient has been seen by nephrology, Dr. Cherly Hensen with [facility] on 11/20/2023.  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  He had previous serologic workup for his renal disease which was unremarkable.  During time of his hospitalization in April 2024 his creatinine was up to 2.8, but improved to 2.2 at the time of discharge.  In September 2024, it was noted his urine protein creatinine ratio was almost 5 g.  His creatinine improved to 4.7 at the time of discharge in September 2024. Review of his record shows his creatinine had been ranging 4.7-5.3 in November and December 2024.  Due to logistical reasons, the patient has been unable to schedule an appointment with vascular surgery for access.  Notably, the patient also had a visit to the ED emergency department at [facility] for left eye vision loss.  He was seen by ophthalmology.  The exam was significant for retinal embolus consistent with his presentation.  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  The patient had a negative CTA head and neck.  It was noted that there was no definitive treatment for CRAO and management focuses on secondary prevention.  The patient was recommended to follow-up with hematology in outpatient setting and to optimize management of his cardiovascular risk factors.   Assessment/Plan: Unresponsive episode -Suspect vasovagal phenomena although cannot completely rule out seizure. -Nevertheless--in the setting of metabolic derangements including  worsening serum creatinine and electrolyte derangements with no prior episodes, would not start patient on AEDs -Check orthostatics -Echocardiogram -MRI brain -Monitor on telemetry--no concerning dysrhythmias presently -Obtain UA -case discussed with  neurology, Dr. Henrene Dodge this clinical setting would not start AEDs unless pt had recurrent episodes.  EEG can be deferred to outpatient setting.  CKD stage V -The patient has progressive CKD -Appreciate nephrology consult -Judicious IV fluids  Hyperkalemia -Improved with IV fluids  Hypertensive urgency -Presented with blood pressure up to 191/91 -Continue carvedilol  History of pulmonary embolus -Initially diagnosed with PE 04/24/2022 -Patient took apixaban for about 3 months -Patient restarted on apixaban during his hospitalization Sept 2024 when he had strokelike symptoms.   -Patient endorses compliance with apixaban since 07/2023  Diabetes mellitus type 2, controlled -11/06/2023 hemoglobin A1c 6.4 -NovoLog sliding scale -Holding glipizide  Acute on chronic anemia of CKD -Hemoglobin dropped to 6.7 -Transfused 2 units PRBC -Anemia panel            Family Communication:   spouse at bedside 1/25  Consultants:  renal  Code Status:  FULL   DVT Prophylaxis:  apixaban   Procedures: As Listed in Progress Note Above  Antibiotics: None    Total time spent 55 minutes.  Greater than 50% spent face to face counseling and coordinating care.  Subjective: Patient denies fevers, chills, headache, chest pain, dyspnea, nausea, vomiting, diarrhea, abdominal pain, dysuria, hematuria, hematochezia, and melena.   Objective: Vitals:   12/15/23 2052 12/15/23 2221 12/16/23 0118 12/16/23 0616  BP: (!) 193/81 124/72 137/73 133/72  Pulse: 77  88 78  Resp: 20  18 20   Temp: 98.1 F (36.7 C)  99.9 F (37.7 C) 99.4 F (37.4 C)  TempSrc: Oral  Oral Oral  SpO2: 100%  100% 100%  Weight:      Height:        Intake/Output Summary (Last 24 hours) at 12/16/2023 1610 Last data filed at 12/16/2023 0600 Gross per 24 hour  Intake 620.33 ml  Output 400 ml  Net 220.33 ml   Weight change:  Exam:  General:  Pt is alert, follows commands appropriately, not in acute  distress HEENT: No icterus, No thrush, No neck mass, [place]/AT Cardiovascular: RRR, S1/S2, no rubs, no gallops Respiratory: CTA bilaterally, no wheezing, no crackles, no rhonchi Abdomen: Soft/+BS, non tender, non distended, no guarding Extremities: No edema, No lymphangitis, No petechiae, No rashes, no synovitis Neuro:  CN II-XII intact, strength 4/5 in RUE, RLE, strength 4/5 LUE, LLE; sensation intact bilateral; no dysmetria; babinski equivocal    Data Reviewed: I have personally reviewed following labs and imaging studies Basic Metabolic Panel: Recent Labs  Lab 12/15/23 1548 12/16/23 0550  NA 136 138  K 5.3* 4.9  CL 106 111  CO2 21* 20*  GLUCOSE 134* 104*  BUN 53* 54*  CREATININE 6.09* 5.97*  CALCIUM 8.0* 7.7*  MG  --  1.8  PHOS  --  5.2*   Liver Function Tests: Recent Labs  Lab 12/15/23 1548 12/16/23 0550  AST 18 12*  ALT 11 9  ALKPHOS 50 48  BILITOT 0.6 0.4  PROT 7.2 6.2*  ALBUMIN 2.8* 2.5*   No results for input(s): "LIPASE", "AMYLASE" in the last 168 hours. No results for input(s): "AMMONIA" in the last 168 hours. Coagulation Profile: No results for input(s): "INR", "PROTIME" in the last 168 hours. CBC: Recent Labs  Lab 12/15/23 1548 12/16/23 0550  WBC 5.6 13.9*  NEUTROABS 3.9  --   HGB  7.7* 6.7*  HCT 24.7* 21.7*  MCV 89.8 89.7  PLT 197 188   Cardiac Enzymes: No results for input(s): "CKTOTAL", "CKMB", "CKMBINDEX", "TROPONINI" in the last 168 hours. BNP: Invalid input(s): "POCBNP" CBG: Recent Labs  Lab 12/15/23 2106 12/16/23 0743  GLUCAP 112* 102*   HbA1C: No results for input(s): "HGBA1C" in the last 72 hours. Urine analysis: No results found for: "COLORURINE", "APPEARANCEUR", "LABSPEC", "PHURINE", "GLUCOSEU", "HGBUR", "BILIRUBINUR", "KETONESUR", "PROTEINUR", "UROBILINOGEN", "NITRITE", "LEUKOCYTESUR" Sepsis Labs: @LABRCNTIP (procalcitonin:4,lacticidven:4) )No results found for this or any previous visit (from the past 240 hours).    Scheduled Meds:  sodium chloride   Intravenous Once   apixaban  5 mg Oral BID   carvedilol  25 mg Oral BID WC   feeding supplement (GLUCERNA SHAKE)  237 mL Oral TID BM   insulin aspart  0-9 Units Subcutaneous TID WC   Continuous Infusions:  sodium chloride 100 mL/hr at 12/16/23 0210    Procedures/Studies: CT Head Wo Contrast Result Date: 12/15/2023 CLINICAL DATA:  Syncope/presyncope, cerebrovascular cause suspected EXAM: CT HEAD WITHOUT CONTRAST TECHNIQUE: Contiguous axial images were obtained from the base of the skull through the vertex without intravenous contrast. RADIATION DOSE REDUCTION: This exam was performed according to the departmental dose-optimization program which includes automated exposure control, adjustment of the mA and/or kV according to patient size and/or use of iterative reconstruction technique. COMPARISON:  None Available. 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 No evidence of acute large vascular territory infarct, acute hemorrhage, mass lesion or midline shift. No hydrocephalus. Vascular: No hyperdense vessel identified. Calcific atherosclerosis. Skull: No acute fracture. Sinuses/Orbits: Clear sinuses.  No acute orbital findings. Other: No mastoid effusions. IMPRESSION: 1. No evidence of acute intracranial abnormality. 2. 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 Electronically Signed   By: Feliberto Harts M.D.   On: 12/15/2023 16:45   DG Chest Port 1 View Result Date: 12/15/2023 CLINICAL DATA:  Shortness of breath EXAM: PORTABLE CHEST 1 VIEW COMPARISON:  04/24/2022 FINDINGS: Low lung volume. Borderline cardiac enlargement. No acute airspace disease or effusion. No pneumothorax IMPRESSION: Low lung volume. No active disease. Electronically Signed   By: Jasmine Pang M.D.   On: 12/15/2023 16:18    Catarina Hartshorn, DO  [facility]  If 7PM-7AM, please contact  night-coverage [email or web address] Password [email or web address] 12/16/2023, 8:12 AM   LOS: 0 days

## 2023-12-16 NOTE — Hospital Course (Addendum)
58 year old male with a history of CKD stage V, diabetes mellitus type 2, hypertension, PE 04/2022, cigar smoking, anemia of CKD and hyperlipidemia presenting with an episode of unresponsiveness.  The patient was at a restaurant sitting at the table when his fiance noted the patient staring off into space.  She asked him if he was feeling okay, and the patient responded, " I do not feel good".  After his response, the patient had an episode of emesis after which he slowly hunched over at the table.  Subsequently a local bystander helped to get the patient off the table and lowered him onto his side on the ground.  After about 2 to 3 minutes, the patient was awake and following commands although he was confused.  There was no bowel or bladder incontinence.  There was no tongue bite.  He has never had a similar episode.  He denies any new medications.  He denies any illicit drugs. Prior to this episode, the patient had been in his usual state of health without any complaints.  Patient denies fevers, chills, headache, chest pain, dyspnea, nausea, vomiting, diarrhea, abdominal pain, dysuria, hematuria, hematochezia, and melena. In the ED, the patient was afebrile and hemodynamically stable with oxygen saturation 94% on room air.  WBC 5.6, hemoglobin 7.7, platelets 197.  Sodium 136, potassium 5.3, bicarbonate 21, serum creatinine 6.09.  LFTs were unremarkable.  CT of the brain was negative for any acute findings.  The patient was admitted for further evaluation and treatment of his episode of unresponsiveness and worsening serum creatinine. Chest x-ray was negative for any acute findings.  EKG showed sinus rhythm with nonspecific T wave changes.  Review of the medical record discussed as below.  Patient has been seen by nephrology, Dr. Cherly Hensen with [facility] on 11/20/2023.  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  He had previous serologic workup for his renal disease which was unremarkable.  During time of his hospitalization in April 2024 his creatinine was up to 2.8, but improved to 2.2 at the time of discharge.  In September 2024, it was noted his urine protein creatinine ratio was almost 5 g.  His creatinine improved to 4.7 at the time of discharge in September 2024. Review of his record shows his creatinine had been ranging 4.7-5.3 in November and December 2024.  Due to logistical reasons, the patient has been unable to schedule an appointment with vascular surgery for access.  Notably, the patient also had a visit to the ED emergency department at [facility] for left eye vision loss.  He was seen by ophthalmology.  The exam was significant for retinal embolus consistent with his presentation.  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  The patient had a negative CTA head and neck.  It was noted that there was no definitive treatment for CRAO and management focuses on secondary prevention.  The patient was recommended to follow-up with hematology in outpatient setting and to optimize management of his cardiovascular risk factors.

## 2023-12-16 NOTE — Plan of Care (Signed)
  Problem: Education: Goal: Knowledge of General Education information will improve Description: Including pain rating scale, medication(s)/side effects and non-pharmacologic comfort measures Outcome: Progressing   Problem: Health Behavior/Discharge Planning: Goal: Ability to manage health-related needs will improve Outcome: Progressing   Problem: Clinical Measurements: Goal: Ability to maintain clinical measurements within normal limits will improve Outcome: Progressing Goal: Will remain free from infection Outcome: Progressing Goal: Diagnostic test results will improve Outcome: Progressing Goal: Respiratory complications will improve Outcome: Progressing Goal: Cardiovascular complication will be avoided Outcome: Progressing   Problem: Activity: Goal: Risk for activity intolerance will decrease Outcome: Progressing   Problem: Coping: Goal: Level of anxiety will decrease Outcome: Progressing   Problem: Elimination: Goal: Will not experience complications related to bowel motility Outcome: Progressing Goal: Will not experience complications related to urinary retention Outcome: Progressing   Problem: Pain Managment: Goal: General experience of comfort will improve and/or be controlled Outcome: Progressing   Problem: Safety: Goal: Ability to remain free from injury will improve Outcome: Progressing   Problem: Skin Integrity: Goal: Risk for impaired skin integrity will decrease Outcome: Progressing   Problem: Education: Goal: Ability to describe self-care measures that may prevent or decrease complications (Diabetes Survival Skills Education) will improve Outcome: Progressing Goal: Individualized Educational Video(s) Outcome: Progressing   Problem: Coping: Goal: Ability to adjust to condition or change in health will improve Outcome: Progressing   Problem: Fluid Volume: Goal: Ability to maintain a balanced intake and output will improve Outcome: Progressing    Problem: Health Behavior/Discharge Planning: Goal: Ability to identify and utilize available resources and services will improve Outcome: Progressing Goal: Ability to manage health-related needs will improve Outcome: Progressing   Problem: Metabolic: Goal: Ability to maintain appropriate glucose levels will improve Outcome: Progressing   Problem: Nutritional: Goal: Progress toward achieving an optimal weight will improve Outcome: Progressing   Problem: Skin Integrity: Goal: Risk for impaired skin integrity will decrease Outcome: Progressing   Problem: Tissue Perfusion: Goal: Adequacy of tissue perfusion will improve Outcome: Progressing   Problem: Education: Goal: Knowledge of condition and prescribed therapy will improve Outcome: Progressing   Problem: Cardiac: Goal: Will achieve and/or maintain adequate cardiac output Outcome: Progressing   Problem: Physical Regulation: Goal: Complications related to the disease process, condition or treatment will be avoided or minimized Outcome: Progressing   Problem: Nutrition: Goal: Adequate nutrition will be maintained Outcome: Not Progressing   Problem: Nutritional: Goal: Maintenance of adequate nutrition will improve Outcome: Not Progressing

## 2023-12-16 NOTE — Progress Notes (Signed)
Patient brought up to the floor from the E.D. via stretcher. Significant other at bedside. Bed in low position. Room and unit orientation given. Call bell within reach. Patient is blind in the left eye.

## 2023-12-16 NOTE — Consult Note (Signed)
Reason for Consult: Renal failure Referring Physician:  Dr. Bethann Berkshire  Chief Complaint: Shortness of breath  Assessment/Plan: CKD5 followed by [facility] nephrology already referred for dialysis access placement but has not received an appointment yet. -No absolute indication for dialysis initiation and should be able to be followed as an outpatient for any uremic symptoms.  Emphasized to the patient and his girlfriend that he needs to ensure that he has an appointment for a permanent access placement soon.  He prefers to go to [facility] for access placement.  -Monitor Daily I/Os, Daily weight  -Maintain MAP>65 for optimal renal perfusion.  - Avoid nephrotoxic agents such as IV contrast, NSAIDs, and phosphate containing bowel preps (FLEETS)  HFpEF EF 60 to 65% on echocardiogram April 24, 2022 Hypertension: Start home regimen Renal osteodystrophy : Check a phosphorus to determine if he needs a binder. Anemia -Check iron panels, replete if needed necessary plus ESA if indicated DM: Per primary BPH: On Flomax   HPI: Wyatt Taylor is an 58 y.o. male with a history of diabetes, hypertension, BPH left eye blindness, chronic kidney disease stage V followed by [facility] nephrology Dr. Gwenith Spitz.  Patient has had acute renal failure on top of chronic kidney disease last seen November 20, 2023.  Patient actually has a biopsy April 2024 showing diabetic and vascular disease with segmental sclerosis and extra capillary hypocellularity in the setting of diabetic glomerulosclerosis.  Time of biopsy patient's creatinine was up to 2.8 but in September 2024 creatinine was up to 5.08 with UPC of almost 5.  Patient does have a history of BPH treated with tamsulosin.  At time of discharge in September 2024 creatinine was approximately 4.7.  She has already been referred for access placement with Dr. Darlin Drop.  Patient presenting after a syncopal episode at a restaurant; patient starts 30 minutes from the followed by vomiting and  then loss of consciousness.  Patient was transported to the emergency department by EMS.  He was noted to be hypertensive in the emergency room with a blood pressure 191/91.  BUN/creatinine was 53 and 6.09 with a negative troponin.  CT head did not show any acute process.  Prior to this episode patient did not have any nausea vomiting, slightly decreased appetite per girlfriend who was bedside.  Patient also denies any shortness of breath.  ROS Pertinent items are noted in HPI.  Chemistry and CBC: Creatinine, Ser  Date/Time Value Ref Range Status  12/15/2023 03:48 PM 6.09 (H) 0.61 - 1.24 mg/dL Final  91/47/8295 62:13 AM 1.64 (H) 0.61 - 1.24 mg/dL Final  08/65/7846 96:29 AM 1.59 (H) 0.61 - 1.24 mg/dL Final   Recent Labs  Lab 12/15/23 1548  NA 136  K 5.3*  CL 106  CO2 21*  GLUCOSE 134*  BUN 53*  CREATININE 6.09*  CALCIUM 8.0*   Recent Labs  Lab 12/15/23 1548  WBC 5.6  NEUTROABS 3.9  HGB 7.7*  HCT 24.7*  MCV 89.8  PLT 197   Liver Function Tests: Recent Labs  Lab 12/15/23 1548  AST 18  ALT 11  ALKPHOS 50  BILITOT 0.6  PROT 7.2  ALBUMIN 2.8*   No results for input(s): "LIPASE", "AMYLASE" in the last 168 hours. No results for input(s): "AMMONIA" in the last 168 hours. Cardiac Enzymes: No results for input(s): "CKTOTAL", "CKMB", "CKMBINDEX", "TROPONINI" in the last 168 hours. Iron Studies: No results for input(s): "IRON", "TIBC", "TRANSFERRIN", "FERRITIN" in the last 72 hours. PT/INR: @LABRCNTIP (inr:5)  Xrays/Other Studies: ) Results for  orders placed or performed during the hospital encounter of 12/15/23 (from the past 48 hours)  CBC with Differential     Status: Abnormal   Collection Time: 12/15/23  3:48 PM  Result Value Ref Range   WBC 5.6 4.0 - 10.5 K/uL   RBC 2.75 (L) 4.22 - 5.81 MIL/uL   Hemoglobin 7.7 (L) 13.0 - 17.0 g/dL   HCT 84.6 (L) 96.2 - 95.2 %   MCV 89.8 80.0 - 100.0 fL   MCH 28.0 26.0 - 34.0 pg   MCHC 31.2 30.0 - 36.0 g/dL   RDW 84.1 (H) 32.4  - 15.5 %   Platelets 197 150 - 400 K/uL   nRBC 0.0 0.0 - 0.2 %   Neutrophils Relative % 71 %   Neutro Abs 3.9 1.7 - 7.7 K/uL   Lymphocytes Relative 20 %   Lymphs Abs 1.1 0.7 - 4.0 K/uL   Monocytes Relative 6 %   Monocytes Absolute 0.3 0.1 - 1.0 K/uL   Eosinophils Relative 2 %   Eosinophils Absolute 0.1 0.0 - 0.5 K/uL   Basophils Relative 1 %   Basophils Absolute 0.0 0.0 - 0.1 K/uL   Immature Granulocytes 0 %   Abs Immature Granulocytes 0.02 0.00 - 0.07 K/uL    Comment: Performed at [facility], [address], [place], [place] [address]  Comprehensive metabolic panel     Status: Abnormal   Collection Time: 12/15/23  3:48 PM  Result Value Ref Range   Sodium 136 135 - 145 mmol/L   Potassium 5.3 (H) 3.5 - 5.1 mmol/L   Chloride 106 98 - 111 mmol/L   CO2 21 (L) 22 - 32 mmol/L   Glucose, Bld 134 (H) 70 - 99 mg/dL    Comment: Glucose reference range applies only to samples taken after fasting for at least 8 hours.   BUN 53 (H) 6 - 20 mg/dL   Creatinine, Ser 7.25 (H) 0.61 - 1.24 mg/dL   Calcium 8.0 (L) 8.9 - 10.3 mg/dL   Total Protein 7.2 6.5 - 8.1 g/dL   Albumin 2.8 (L) 3.5 - 5.0 g/dL   AST 18 15 - 41 U/L   ALT 11 0 - 44 U/L   Alkaline Phosphatase 50 38 - 126 U/L   Total Bilirubin 0.6 0.0 - 1.2 mg/dL   GFR, Estimated 10 (L) >60 mL/min    Comment: (NOTE) Calculated using the CKD-EPI Creatinine Equation (2021)    Anion gap 9 5 - 15    Comment: Performed at [facility], [address], [place], [place] [address]  Troponin I (High Sensitivity)     Status: Abnormal   Collection Time: 12/15/23  3:48 PM  Result Value Ref Range   Troponin I (High Sensitivity) 24 (H) <18 ng/L    Comment: (NOTE) Elevated high sensitivity troponin I (hsTnI) values and significant  changes across serial measurements may suggest ACS but many other  chronic and acute conditions are known to elevate hsTnI results.  Refer to the "Links" section for chest pain algorithms and additional  guidance. Performed  at [facility], [address], [place], [place] [address]   Troponin I (High Sensitivity)     Status: Abnormal   Collection Time: 12/15/23  5:31 PM  Result Value Ref Range   Troponin I (High Sensitivity) 24 (H) <18 ng/L    Comment: (NOTE) Elevated high sensitivity troponin I (hsTnI) values and significant  changes across serial measurements may suggest ACS but many other  chronic and acute  conditions are known to elevate hsTnI results.  Refer to the "Links" section for chest pain algorithms and additional  guidance. Performed at [facility], [address] [place], [place] [address]   Glucose, capillary     Status: Abnormal   Collection Time: 12/15/23  9:06 PM  Result Value Ref Range   Glucose-Capillary 112 (H) 70 - 99 mg/dL    Comment: Glucose reference range applies only to samples taken after fasting for at least 8 hours.   CT Head Wo Contrast Result Date: 12/15/2023 CLINICAL DATA:  Syncope/presyncope, cerebrovascular cause suspected EXAM: CT HEAD WITHOUT CONTRAST TECHNIQUE: Contiguous axial images were obtained from the base of the skull through the vertex without intravenous contrast. RADIATION DOSE REDUCTION: This exam was performed according to the departmental dose-optimization program which includes automated exposure control, adjustment of the mA and/or kV according to patient size and/or use of iterative reconstruction technique. COMPARISON:  None Available. 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 No evidence of acute large vascular territory infarct, acute hemorrhage, mass lesion or midline shift. No hydrocephalus. Vascular: No hyperdense vessel identified. Calcific atherosclerosis. Skull: No acute fracture. Sinuses/Orbits: Clear sinuses.  No acute orbital findings. Other: No mastoid effusions. IMPRESSION: 1. No evidence of acute intracranial abnormality. 2. 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 Electronically Signed   By: Feliberto Harts M.D.   On: 12/15/2023 16:45   DG Chest Port 1 View Result Date: 12/15/2023 CLINICAL DATA:  Shortness of breath EXAM: PORTABLE CHEST 1 VIEW COMPARISON:  04/24/2022 FINDINGS: Low lung volume. Borderline cardiac enlargement. No acute airspace disease or effusion. No pneumothorax IMPRESSION: Low lung volume. No active disease. Electronically Signed   By: Jasmine Pang M.D.   On: 12/15/2023 16:18    PMH:   Past Medical History:  Diagnosis Date   Blind left eye    Chronic renal failure, stage 3a (HCC)    HTN (hypertension)    Type II diabetes mellitus with renal manifestations (HCC)     PSH:  History reviewed. No pertinent surgical history.  Allergies: No Known Allergies  Medications:   Prior to Admission medications   Medication Sig Start Date End Date Taking? Authorizing Provider  apixaban (ELIQUIS) 5 MG TABS tablet Take 1 tablet (5 mg total) by mouth 2 (two) times daily. Start AFTER completing starter pack 05/25/22  Yes Wouk, Wilfred Curtis, MD  calcitRIOL (ROCALTROL) 0.25 MCG capsule Take 0.25 mcg by mouth daily.   Yes [provider]  carvedilol (COREG) 25 MG tablet Take 25 mg by mouth 2 (two) times daily with a meal.   Yes [provider]  furosemide (LASIX) 40 MG tablet Take 40 mg by mouth 2 (two) times daily.   Yes [provider]  glipiZIDE (GLUCOTROL) 10 MG tablet Take 2.5 mg by mouth daily before breakfast. If blood sugar less than or equal to 70, do not take medication.   Yes [provider]  sodium bicarbonate 650 MG tablet Take 650 mg by mouth 3 (three) times daily.   Yes [provider]  amLODipine (NORVASC) 5 MG tablet Take 10 mg by mouth daily. Patient not taking: Reported on 12/15/2023    [provider]  APIXABAN Everlene Balls) VTE STARTER PACK (10MG  AND 5MG ) Take as directed on package: start with two-5mg  tablets twice daily for 7 days. On day 8, switch to one-5mg   tablet twice daily. 04/25/22   Wouk, Anette Riedel  Bedford, MD  losartan (COZAAR) 25 MG tablet Take 25 mg by mouth daily. Patient not taking: Reported on 12/15/2023    [provider]  metFORMIN (GLUCOPHAGE) 1000 MG tablet Take 1,000 mg by mouth 2 (two) times daily with a meal. Patient not taking: Reported on 12/15/2023    [provider]    Discontinued Meds:   Medications Discontinued During This Encounter  Medication Reason   amLODipine (NORVASC) tablet 10 mg    losartan (COZAAR) tablet 25 mg    heparin injection 5,000 Units     Social History:  reports that he has been smoking cigarettes. He has never used smokeless tobacco. He reports that he does not currently use alcohol. He reports that he does not use drugs.  Family History:   Family History  Problem Relation Age of Onset   Deep vein thrombosis Daughter    Pulmonary embolism Daughter     Blood pressure 137/73, pulse 88, temperature 99.9 F (37.7 C), temperature source Oral, resp. rate 18, height 5\' 10"  (1.778 m), weight 81.6 kg, SpO2 100%. CONSTITUTIONAL: Alert, NAD HEENT: oropharynx clear  NECK: Supple, no LAD noted CARDIOVASCULAR: RRR, no murmurs PULM: CTA b/l, no rales GASTROINTESTINAL: SNDNT+BS MSK/EXTREMITIES: 1+ lower extremity edema bilaterally, dorsalis pedis pulses 2+ bilaterally  SKIN: No rashes or lesions NEUROLOGIC: No focal motor or sensory deficits        Ethelene Hal, MD 12/16/2023, 6:05 AM

## 2023-12-17 DIAGNOSIS — N179 Acute kidney failure, unspecified: Secondary | ICD-10-CM | POA: Diagnosis not present

## 2023-12-17 DIAGNOSIS — R7989 Other specified abnormal findings of blood chemistry: Secondary | ICD-10-CM | POA: Diagnosis not present

## 2023-12-17 DIAGNOSIS — I160 Hypertensive urgency: Secondary | ICD-10-CM | POA: Diagnosis not present

## 2023-12-17 DIAGNOSIS — R55 Syncope and collapse: Secondary | ICD-10-CM | POA: Diagnosis not present

## 2023-12-17 LAB — TYPE AND SCREEN
ABO/RH(D): A POS
Antibody Screen: NEGATIVE
Unit division: 0
Unit division: 0

## 2023-12-17 LAB — BPAM RBC
Blood Product Expiration Date: 202502042359
Blood Product Expiration Date: 202502042359
ISSUE DATE / TIME: 202501251329
ISSUE DATE / TIME: 202501251644
Unit Type and Rh: 6200
Unit Type and Rh: 6200

## 2023-12-17 LAB — CBC
HCT: 25.9 % — ABNORMAL LOW (ref 39.0–52.0)
Hemoglobin: 8.5 g/dL — ABNORMAL LOW (ref 13.0–17.0)
MCH: 29 pg (ref 26.0–34.0)
MCHC: 32.8 g/dL (ref 30.0–36.0)
MCV: 88.4 fL (ref 80.0–100.0)
Platelets: 158 10*3/uL (ref 150–400)
RBC: 2.93 MIL/uL — ABNORMAL LOW (ref 4.22–5.81)
RDW: 15.3 % (ref 11.5–15.5)
WBC: 7.3 10*3/uL (ref 4.0–10.5)
nRBC: 0 % (ref 0.0–0.2)

## 2023-12-17 LAB — RENAL FUNCTION PANEL
Albumin: 2.4 g/dL — ABNORMAL LOW (ref 3.5–5.0)
Anion gap: 9 (ref 5–15)
BUN: 61 mg/dL — ABNORMAL HIGH (ref 6–20)
CO2: 21 mmol/L — ABNORMAL LOW (ref 22–32)
Calcium: 7.5 mg/dL — ABNORMAL LOW (ref 8.9–10.3)
Chloride: 107 mmol/L (ref 98–111)
Creatinine, Ser: 6.08 mg/dL — ABNORMAL HIGH (ref 0.61–1.24)
GFR, Estimated: 10 mL/min — ABNORMAL LOW (ref >60)
Glucose, Bld: 131 mg/dL — ABNORMAL HIGH (ref 70–99)
Phosphorus: 6 mg/dL — ABNORMAL HIGH (ref 2.5–4.6)
Potassium: 4.8 mmol/L (ref 3.5–5.1)
Sodium: 137 mmol/L (ref 135–145)

## 2023-12-17 LAB — GLUCOSE, CAPILLARY
Glucose-Capillary: 139 mg/dL — ABNORMAL HIGH (ref 70–99)
Glucose-Capillary: 163 mg/dL — ABNORMAL HIGH (ref 70–99)

## 2023-12-17 MED ORDER — SODIUM CHLORIDE 0.9 % IV SOLN
100.0000 mg | INTRAVENOUS | Status: DC
  Administered 2023-12-17: 100 mg via INTRAVENOUS
  Filled 2023-12-17: qty 5

## 2023-12-17 NOTE — Plan of Care (Signed)
Problem: Education: Goal: Knowledge of General Education information will improve Description: Including pain rating scale, medication(s)/side effects and non-pharmacologic comfort measures Outcome: Progressing   Problem: Health Behavior/Discharge Planning: Goal: Ability to manage health-related needs will improve Outcome: Progressing   Problem: Clinical Measurements: Goal: Cardiovascular complication will be avoided Outcome: Progressing

## 2023-12-17 NOTE — Discharge Summary (Signed)
Physician Discharge Summary   Patient: Wyatt Taylor MRN: [id number] DOB: [birth year]  Admit date:     12/15/2023  Discharge date: 12/17/23  Discharge Physician: Onalee Hua Renee Beale   PCP: [facility]   Recommendations at discharge:   Please follow up with primary care provider within 1-2 weeks  Please repeat BMP and CBC in one week     Hospital Course: 58 year old male with a history of CKD stage V, diabetes mellitus type 2, hypertension, PE 04/2022, cigar smoking, anemia of CKD and hyperlipidemia presenting with an episode of unresponsiveness.  The patient was at a restaurant sitting at the table when his fiance noted the patient staring off into space.  She asked him if he was feeling okay, and the patient responded, " I do not feel good".  After his response, the patient had an episode of emesis after which he slowly hunched over at the table.  Subsequently a local bystander helped to get the patient off the table and lowered him onto his side on the ground.  After about 2 to 3 minutes, the patient was awake and following commands although he was confused.  There was no bowel or bladder incontinence.  There was no tongue bite.  He has never had a similar episode.  He denies any new medications.  He denies any illicit drugs. Prior to this episode, the patient had been in his usual state of health without any complaints.  Patient denies fevers, chills, headache, chest pain, dyspnea, nausea, vomiting, diarrhea, abdominal pain, dysuria, hematuria, hematochezia, and melena. In the ED, the patient was afebrile and hemodynamically stable with oxygen saturation 94% on room air.  WBC 5.6, hemoglobin 7.7, platelets 197.  Sodium 136, potassium 5.3, bicarbonate 21, serum creatinine 6.09.  LFTs were unremarkable.  CT of the brain was negative for any acute findings.  The patient was admitted for further evaluation and treatment of his episode of unresponsiveness and worsening serum  creatinine. Chest x-ray was negative for any acute findings.  EKG showed sinus rhythm with nonspecific T wave changes.  Review of the medical record discussed as below.  Patient has been seen by nephrology, Dr. Cherly Hensen with [facility] on 11/20/2023.  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  He had previous serologic workup for his renal disease which was unremarkable.  During time of his hospitalization in April 2024 his creatinine was up to 2.8, but improved to 2.2 at the time of discharge.  In September 2024, it was noted his urine protein creatinine ratio was almost 5 g.  His creatinine improved to 4.7 at the time of discharge in September 2024. Review of his record shows his creatinine had been ranging 4.7-5.3 in November and December 2024.  Due to logistical reasons, the patient has been unable to schedule an appointment with vascular surgery for access.  Notably, the patient also had a visit to the ED emergency department at [facility] for left eye vision loss.  He was seen by ophthalmology.  The exam was significant for retinal embolus consistent with his presentation.  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  The patient had a negative CTA head and neck.  It was noted that there was no definitive treatment for CRAO and management focuses on secondary prevention.  The  patient was recommended to follow-up with hematology in outpatient setting and to optimize management of his cardiovascular risk factors.  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 Henrene Dodge this clinical setting would not start AEDs unless pt had recurrent episodes.  EEG can be deferred to outpatient setting.   CKD stage V -The patient has progressive CKD -Appreciate nephrology consult>>clear pt for d/c with follow with to [facility] nephrology and vascular surgery -Judicious IV fluids -serum creatinine 5.9-6.0   Hyperkalemia -Improved with IV fluids   Hypertensive urgency -Presented with blood pressure up to 191/91 -Continue carvedilol -difficult to treat in setting of autonomic dysfunction and orthostasis   History of pulmonary embolus -Initially diagnosed with PE 04/24/2022 -Patient took apixaban for about 3 months -Patient restarted on apixaban during his hospitalization Sept 2024 when he had strokelike symptoms.   -Patient endorses compliance with apixaban since 07/2023 -continue apixaban   Diabetes mellitus type 2, controlled -11/06/2023 hemoglobin A1c 6.4 -NovoLog sliding scale -Holding glipizide>>restart after dc -follow up PCP   Acute on chronic anemia of CKD -Hemoglobin dropped to 6.7 -Transfused 2 units PRBC -given venofer x 1 prior to d/c -Hgb 8.5 on day of dc       Consultants: renal Procedures performed: none  Disposition: Home Diet recommendation:  Renal diet DISCHARGE MEDICATION: Allergies as of 12/17/2023   No Known Allergies      Medication List     STOP taking these medications     amLODipine 5 MG tablet Commonly known as: NORVASC   furosemide 40 MG tablet Commonly known as: LASIX   losartan 25 MG tablet Commonly known as: COZAAR   metFORMIN 1000 MG tablet Commonly known as: GLUCOPHAGE       TAKE these medications    apixaban 5 MG Tabs tablet Commonly known as: ELIQUIS Take 1 tablet (5 mg total) by mouth 2 (two) times daily. Start AFTER completing starter pack What changed: Another medication with the same name was removed. Continue taking this medication, and follow the directions you see here.   calcitRIOL 0.25 MCG capsule Commonly known as: ROCALTROL Take 0.25 mcg by mouth daily.   carvedilol 25 MG tablet Commonly known as: COREG Take 25 mg by mouth 2 (two) times daily with a meal.   glipiZIDE 10 MG tablet Commonly known as: GLUCOTROL Take 2.5 mg by mouth daily before breakfast. If blood sugar less than or equal to 70, do not take medication.   sodium bicarbonate 650 MG tablet Take 650 mg by mouth 3 (three) times daily.        Discharge Exam: Filed Weights   12/15/23 1429  Weight: 81.6 kg   HEENT:  [place]/AT, No thrush, no icterus CV:  RRR, no rub, no S3, no S4 Lung:  CTA, no wheeze, no rhonchi Abd:  soft/+BS, NT Ext:  No edema, no lymphangitis, no synovitis, no rash Neuro:  CN II-XII intact, strength 4/5 in RUE, RLE, strength 4/5 LUE, LLE;  sensation intact bilateral; no dysmetria; babinski equivocal   Condition at discharge: stable  The results of significant diagnostics from this hospitalization (including imaging, microbiology, ancillary and laboratory) are listed below for reference.   Imaging Studies: MR BRAIN WO CONTRAST Result Date: 12/16/2023 CLINICAL DATA:  Seizure, new-onset, no history of trauma EXAM: MRI HEAD WITHOUT CONTRAST TECHNIQUE: Multiplanar, multiecho pulse sequences of the brain and surrounding structures were obtained without intravenous contrast. COMPARISON:  CT head 12/15/2023. FINDINGS: Brain: No acute  infarction, hemorrhage, hydrocephalus, extra-axial collection or mass lesion. 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 Associated encephalomalacia and gliosis. Vascular: Major arterial flow voids are maintained skull base. Skull and upper cervical spine: Normal marrow signal. Sinuses/Orbits: Mostly clear sinuses.  No acute orbital findings. Other: No mastoid effusions. IMPRESSION: 1. No evidence of acute intracranial abnormality. 2. 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 Electronically Signed   By: Feliberto Harts M.D.   On: 12/16/2023 16:56   ECHOCARDIOGRAM COMPLETE Result Date: 12/16/2023    ECHOCARDIOGRAM REPORT   Patient Name:   TANNON PEERSON Date of Exam: 12/16/2023 Medical Rec #:  [id number]       Height:       70.0 in Accession #:    [id number]      Weight:       180.0 lb Date of Birth:  1966-09-11        BSA:          1.996 m Patient Age:    57 years        BP:           148/67 mmHg Patient Gender: M               HR:           80 bpm. [facility] Procedure: 2D Echo, Cardiac Doppler, Color Doppler and Intracardiac            Opacification Agent Indications:    Syncope  History:        Patient has prior history of Echocardiogram examinations, most                 recent 04/24/2022. Risk Factors:Hypertension, Diabetes and Current                 Smoker.  Sonographer:    Karma Ganja Referring Phys: [id number] OLADAPO ADEFESO IMPRESSIONS  1. Left ventricular ejection fraction, by estimation, is 55 to 60%. The left ventricle has normal function. The left ventricle has no regional wall motion abnormalities. There is severe concentric left ventricular hypertrophy. Left ventricular diastolic  parameters are consistent with Grade I diastolic dysfunction (impaired relaxation).  2. Right ventricular systolic function is normal. The right ventricular size is normal.  3. Left atrial size was mildly dilated.  4. The mitral valve is normal in structure. Trivial  mitral valve regurgitation. No evidence of mitral stenosis.  5. The aortic valve is normal in structure. Aortic valve regurgitation is not visualized. Aortic valve sclerosis/calcification is present, without any evidence of aortic stenosis.  6. The inferior vena cava is normal in size with greater than 50% respiratory variability, suggesting right atrial pressure of 3 mmHg. FINDINGS  Left Ventricle: Left ventricular ejection fraction, by estimation, is 55 to 60%. The left ventricle has normal function. The left ventricle has no regional wall motion abnormalities. The left ventricular internal cavity size was normal in size. There is  severe concentric left ventricular hypertrophy. Left  ventricular diastolic parameters are consistent with Grade I diastolic dysfunction (impaired relaxation). Right Ventricle: The right ventricular size is normal. No increase in right ventricular wall thickness. Right ventricular systolic function is normal. Left Atrium: Left atrial size was mildly dilated. Right Atrium: Right atrial size was normal in size. Pericardium: There is no evidence of pericardial effusion. Mitral Valve: The mitral valve is normal in structure. Trivial mitral valve regurgitation. No evidence of mitral valve stenosis. Tricuspid Valve: The tricuspid valve is normal in structure. Tricuspid valve regurgitation is trivial. No evidence of tricuspid stenosis. Aortic Valve: The aortic valve is normal in structure. Aortic valve regurgitation is not visualized. Aortic valve sclerosis/calcification is present, without any evidence of aortic stenosis. Aortic valve mean gradient measures 6.0 mmHg. Aortic valve peak  gradient measures 10.4 mmHg. Aortic valve area, by VTI measures 2.08 cm. Pulmonic Valve: The pulmonic valve was normal in structure. Pulmonic valve regurgitation is not visualized. No evidence of pulmonic stenosis. Aorta: The aortic root is normal in size and structure. Venous: The inferior vena cava is normal  in size with greater than 50% respiratory variability, suggesting right atrial pressure of 3 mmHg. IAS/Shunts: No atrial level shunt detected by color flow Doppler.  LEFT VENTRICLE PLAX 2D LVIDd:         5.40 cm   Diastology LVIDs:         3.30 cm   LV e' medial:    5.11 cm/s LV PW:         1.70 cm   LV E/e' medial:  15.0 LV IVS:        1.70 cm   LV e' lateral:   6.53 cm/s LVOT diam:     2.00 cm   LV E/e' lateral: 11.7 LV SV:         62 LV SV Index:   31 LVOT Area:     3.14 cm                           3D Volume EF:                          3D EF:        53 %                          LV EDV:       156 ml                          LV ESV:       73 ml                          LV SV:        83 ml RIGHT VENTRICLE             IVC RV Basal diam:  3.90 cm     IVC diam: 1.70 cm RV S prime:     16.30 cm/s TAPSE (M-mode): 2.3 cm LEFT ATRIUM             Index        RIGHT ATRIUM           Index LA diam:        4.90 cm 2.46 cm/m   RA Area:     16.40 cm LA Vol (A2C):  78.5 ml 39.33 ml/m  RA Volume:   43.60 ml  21.84 ml/m LA Vol (A4C):   68.2 ml 34.17 ml/m LA Biplane Vol: 75.1 ml 37.63 ml/m  AORTIC VALVE AV Area (Vmax):    2.32 cm AV Area (Vmean):   2.07 cm AV Area (VTI):     2.08 cm AV Vmax:           161.00 cm/s AV Vmean:          112.000 cm/s AV VTI:            0.296 m AV Peak Grad:      10.4 mmHg AV Mean Grad:      6.0 mmHg LVOT Vmax:         119.00 cm/s LVOT Vmean:        73.700 cm/s LVOT VTI:          0.196 m LVOT/AV VTI ratio: 0.66  AORTA Ao Root diam: 3.40 cm MITRAL VALVE MV Area (PHT): 3.46 cm    SHUNTS MV Decel Time: 219 msec    Systemic VTI:  0.20 m MV E velocity: 76.70 cm/s  Systemic Diam: 2.00 cm MV A velocity: 88.30 cm/s MV E/A ratio:  0.87 Kardie Tobb DO Electronically signed by Thomasene Ripple DO Signature Date/Time: 12/16/2023/1:05:42 PM    Final    CT Head Wo Contrast Result Date: 12/15/2023 CLINICAL DATA:  Syncope/presyncope, cerebrovascular cause suspected EXAM: CT HEAD WITHOUT CONTRAST TECHNIQUE:  Contiguous axial images were obtained from the base of the skull through the vertex without intravenous contrast. RADIATION DOSE REDUCTION: This exam was performed according to the departmental dose-optimization program which includes automated exposure control, adjustment of the mA and/or kV according to patient size and/or use of iterative reconstruction technique. COMPARISON:  None Available. 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 No evidence of acute large vascular territory infarct, acute hemorrhage, mass lesion or midline shift. No hydrocephalus. Vascular: No hyperdense vessel identified. Calcific atherosclerosis. Skull: No acute fracture. Sinuses/Orbits: Clear sinuses.  No acute orbital findings. Other: No mastoid effusions. IMPRESSION: 1. No evidence of acute intracranial abnormality. 2. 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 Electronically Signed   By: Feliberto Harts M.D.   On: 12/15/2023 16:45   DG Chest Port 1 View Result Date: 12/15/2023 CLINICAL DATA:  Shortness of breath EXAM: PORTABLE CHEST 1 VIEW COMPARISON:  04/24/2022 FINDINGS: Low lung volume. Borderline cardiac enlargement. No acute airspace disease or effusion. No pneumothorax IMPRESSION: Low lung volume. No active disease. Electronically Signed   By: Jasmine Pang M.D.   On: 12/15/2023 16:18    Microbiology: No results found for this or any previous visit.  Labs: CBC: Recent Labs  Lab 12/15/23 1548 12/16/23 0550 12/17/23 0533  WBC 5.6 13.9* 7.3  NEUTROABS 3.9  --   --   HGB 7.7* 6.7* 8.5*  HCT 24.7* 21.7* 25.9*  MCV 89.8 89.7 88.4  PLT 197 188 158   Basic Metabolic Panel: Recent Labs  Lab 12/15/23 1548 12/16/23 0550 12/17/23 0533  NA 136 138 137  K 5.3* 4.9 4.8  CL 106 111 107  CO2 21* 20* 21*  GLUCOSE 134* 104* 131*  BUN 53* 54* 61*  CREATININE 6.09* 5.97* 6.08*  CALCIUM 8.0* 7.7* 7.5*  MG  --  1.8  --    PHOS  --  5.1*  5.2* 6.0*   Liver Function Tests: Recent Labs  Lab  12/15/23 1548 12/16/23 0550 12/17/23 0533  AST 18 12*  --   ALT 11 9  --   ALKPHOS 50 48  --   BILITOT 0.6 0.4  --   PROT 7.2 6.2*  --   ALBUMIN 2.8* 2.5* 2.4*   CBG: Recent Labs  Lab 12/16/23 0743 12/16/23 1149 12/16/23 1608 12/16/23 2033 12/17/23 0726  GLUCAP 102* 132* 142* 137* 139*    Discharge time spent: greater than 30 minutes.  Signed: Catarina Hartshorn, MD [facility] 12/17/2023

## 2023-12-17 NOTE — Progress Notes (Signed)
Wyatt Taylor is an 58 y.o. male with a h/o  DM, HTN, BPH left eye blindness,CKD V followed by [facility] nephrology Dr. Gwenith Spitz.  AKI on CKD4 November 20, 2023 w/ biopsy April 2024 showing diabetic and vascular disease with segmental sclerosis and extra capillary hypocellularity in the setting of diabetic glomerulosclerosis.  Time of biopsy patient's creatinine was up to 2.8 but in September 2024 creatinine was up to 5.08 with UPC of almost 5.  At time of discharge in September 2024 creatinine was approximately 4.7.  Already referred for access placement with Dr. Darlin Drop.  Patient presenting after a syncopal episode at a restaurant.  Assessment/Plan: CKD5 followed by [facility] nephrology already referred for dialysis access placement but has not received an appointment yet. -No absolute indication for dialysis initiation and should be able to be followed as an outpatient for any uremic symptoms.  Emphasized to the patient and his girlfriend that he needs to ensure that he has an appointment for a permanent access placement soon.  He prefers to go to [facility] for access placement.  Still no uremic symptoms today; no further episodes of syncope, dizziness and he has been ambulating without any difficulty.  -Monitor Daily I/Os, Daily weight  -Maintain MAP>65 for optimal renal perfusion.  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 DM: Per primary BPH: On Flomax  Subjective: Feeling better with no dizziness on ambulation.  Patient denies any shortness of breath, nausea, vomiting, dysuria.   Chemistry and CBC: Creatinine, Ser  Date/Time Value Ref Range Status  12/16/2023 05:50 AM 5.97 (H) 0.61 - 1.24 mg/dL Final  96/02/5408 81:19 PM 6.09 (H) 0.61 - 1.24 mg/dL Final   14/78/2956 21:30 AM 1.64 (H) 0.61 - 1.24 mg/dL Final  86/57/8469 62:95 AM 1.59 (H) 0.61 - 1.24 mg/dL Final   Recent Labs  Lab 12/15/23 1548 12/16/23 0550  NA 136 138  K 5.3* 4.9  CL 106 111  CO2 21* 20*  GLUCOSE 134* 104*  BUN 53* 54*  CREATININE 6.09* 5.97*  CALCIUM 8.0* 7.7*  PHOS  --  5.1*  5.2*   Recent Labs  Lab 12/15/23 1548 12/16/23 0550  WBC 5.6 13.9*  NEUTROABS 3.9  --   HGB 7.7* 6.7*  HCT 24.7* 21.7*  MCV 89.8 89.7  PLT 197 188   Liver Function Tests: Recent Labs  Lab 12/15/23 1548 12/16/23 0550  AST 18 12*  ALT 11 9  ALKPHOS 50 48  BILITOT 0.6 0.4  PROT 7.2 6.2*  ALBUMIN 2.8* 2.5*   No results for input(s): "LIPASE", "AMYLASE" in the last 168 hours. No results for input(s): "AMMONIA" in the last 168 hours. Cardiac Enzymes: Recent Labs  Lab 12/16/23 0831  CKTOTAL 199   Iron Studies:  Recent Labs    12/16/23 0550  IRON 23*  TIBC 195*  FERRITIN 129   PT/INR: @LABRCNTIP (inr:5)  Xrays/Other Studies: ) Results for orders placed or performed during the hospital encounter of 12/15/23 (from the past 48 hours)  CBC with Differential     Status: Abnormal   Collection Time: 12/15/23  3:48 PM  Result Value Ref Range   WBC 5.6 4.0 - 10.5 K/uL   RBC 2.75 (L) 4.22 - 5.81 MIL/uL   Hemoglobin 7.7 (L) 13.0 - 17.0 g/dL  HCT 24.7 (L) 39.0 - 52.0 %   MCV 89.8 80.0 - 100.0 fL   MCH 28.0 26.0 - 34.0 pg   MCHC 31.2 30.0 - 36.0 g/dL   RDW 16.1 (H) 09.6 - 04.5 %   Platelets 197 150 - 400 K/uL   nRBC 0.0 0.0 - 0.2 %   Neutrophils Relative % 71 %   Neutro Abs 3.9 1.7 - 7.7 K/uL   Lymphocytes Relative 20 %   Lymphs Abs 1.1 0.7 - 4.0 K/uL   Monocytes Relative 6 %   Monocytes Absolute 0.3 0.1 - 1.0 K/uL   Eosinophils Relative 2 %   Eosinophils Absolute 0.1 0.0 - 0.5 K/uL   Basophils Relative 1 %   Basophils Absolute 0.0 0.0 - 0.1 K/uL   Immature Granulocytes 0 %   Abs Immature Granulocytes 0.02 0.00 - 0.07 K/uL    Comment: Performed at [facility], [address] [place], [place] [address]  Comprehensive metabolic panel     Status: Abnormal   Collection Time: 12/15/23  3:48 PM  Result Value Ref Range   Sodium 136 135 - 145 mmol/L   Potassium 5.3 (H) 3.5 - 5.1 mmol/L   Chloride 106 98 - 111 mmol/L   CO2 21 (L) 22 - 32 mmol/L   Glucose, Bld 134 (H) 70 - 99 mg/dL    Comment: Glucose reference range applies only to samples taken after fasting for at least 8 hours.   BUN 53 (H) 6 - 20 mg/dL   Creatinine, Ser 1.91 (H) 0.61 - 1.24 mg/dL   Calcium 8.0 (L) 8.9 - 10.3 mg/dL   Total Protein 7.2 6.5 - 8.1 g/dL   Albumin 2.8 (L) 3.5 - 5.0 g/dL   AST 18 15 - 41 U/L   ALT 11 0 - 44 U/L   Alkaline Phosphatase 50 38 - 126 U/L   Total Bilirubin 0.6 0.0 - 1.2 mg/dL   GFR, Estimated 10 (L) >60 mL/min    Comment: (NOTE) Calculated using the CKD-EPI Creatinine Equation (2021)    Anion gap 9 5 - 15    Comment: Performed at [facility], [address], [place], [place] [address]  Troponin I (High Sensitivity)     Status: Abnormal   Collection Time: 12/15/23  3:48 PM  Result Value Ref Range   Troponin I (High Sensitivity) 24 (H) <18 ng/L    Comment: (NOTE) Elevated high sensitivity troponin I (hsTnI) values and significant  changes across serial measurements may suggest ACS but many other  chronic and acute conditions are known to elevate hsTnI results.  Refer to the "Links" section for chest pain algorithms and additional  guidance. Performed at [facility], [address], [place], [place] [address]   Hemoglobin A1c     Status: Abnormal   Collection Time: 12/15/23  3:48 PM  Result Value Ref Range   Hgb A1c MFr Bld 5.9 (H) 4.8 - 5.6 %    Comment: (NOTE) Pre diabetes:          5.7%-6.4%  Diabetes:              >6.4%  Glycemic control for   <7.0% adults with diabetes    Mean Plasma Glucose 122.63 mg/dL    Comment: Performed at [facility], [address] [place], [place] [address]  Troponin I (High Sensitivity)     Status:  Abnormal   Collection Time: 12/15/23  5:31 PM  Result Value Ref Range   Troponin  I (High Sensitivity) 24 (H) <18 ng/L    Comment: (NOTE) Elevated high sensitivity troponin I (hsTnI) values and significant  changes across serial measurements may suggest ACS but many other  chronic and acute conditions are known to elevate hsTnI results.  Refer to the "Links" section for chest pain algorithms and additional  guidance. Performed at [facility], [address] [place], [place] [address]   Glucose, capillary     Status: Abnormal   Collection Time: 12/15/23  9:06 PM  Result Value Ref Range   Glucose-Capillary 112 (H) 70 - 99 mg/dL    Comment: Glucose reference range applies only to samples taken after fasting for at least 8 hours.  ABO/Rh     Status: None   Collection Time: 12/16/23  5:45 AM  Result Value Ref Range   ABO/RH(D)      A POS Performed at [facility], [address] [place], [place] [address]   Prepare RBC (crossmatch)     Status: None   Collection Time: 12/16/23  5:45 AM  Result Value Ref Range   Order Confirmation      ORDER PROCESSED BY BLOOD BANK Performed at [facility], [address] [place], [place] [address]   HIV Antibody (routine testing w rflx)     Status: None   Collection Time: 12/16/23  5:50 AM  Result Value Ref Range   HIV Screen 4th Generation wRfx Non Reactive Non Reactive    Comment: Performed at [facility], [address], [place], [place] [address]  Comprehensive metabolic panel     Status: Abnormal   Collection Time: 12/16/23  5:50 AM  Result Value Ref Range   Sodium 138 135 - 145 mmol/L   Potassium 4.9 3.5 - 5.1 mmol/L   Chloride 111 98 - 111 mmol/L   CO2 20 (L) 22 - 32 mmol/L   Glucose, Bld 104 (H) 70 - 99 mg/dL    Comment: Glucose reference range applies only to samples taken after fasting for at least 8 hours.   BUN 54 (H) 6 - 20 mg/dL   Creatinine, Ser 2.95 (H) 0.61 - 1.24 mg/dL   Calcium 7.7 (L) 8.9 - 10.3 mg/dL   Total  Protein 6.2 (L) 6.5 - 8.1 g/dL   Albumin 2.5 (L) 3.5 - 5.0 g/dL   AST 12 (L) 15 - 41 U/L   ALT 9 0 - 44 U/L   Alkaline Phosphatase 48 38 - 126 U/L   Total Bilirubin 0.4 0.0 - 1.2 mg/dL   GFR, Estimated 10 (L) >60 mL/min    Comment: (NOTE) Calculated using the CKD-EPI Creatinine Equation (2021)    Anion gap 7 5 - 15    Comment: Performed at [facility], [address] [place], [place] [address]  CBC     Status: Abnormal   Collection Time: 12/16/23  5:50 AM  Result Value Ref Range   WBC 13.9 (H) 4.0 - 10.5 K/uL   RBC 2.42 (L) 4.22 - 5.81 MIL/uL   Hemoglobin 6.7 (LL) 13.0 - 17.0 g/dL    Comment: REPEATED TO VERIFY THIS CRITICAL RESULT HAS VERIFIED AND BEEN CALLED TO R TEJEDA BY SHANA DALTON ON 01 25 2025 AT 0733, AND HAS BEEN READ BACK.     HCT 21.7 (L) 39.0 - 52.0 %   MCV 89.7 80.0 - 100.0 fL   MCH 27.7 26.0 - 34.0 pg   MCHC 30.9 30.0 - 36.0 g/dL   RDW 86.5 (H) 78.4 - 69.6 %  Platelets 188 150 - 400 K/uL   nRBC 0.0 0.0 - 0.2 %    Comment: Performed at [facility], [address] [place], [place] [address]  Magnesium     Status: None   Collection Time: 12/16/23  5:50 AM  Result Value Ref Range   Magnesium 1.8 1.7 - 2.4 mg/dL    Comment: Performed at [facility], [address], [place], [place] [address]  Phosphorus     Status: Abnormal   Collection Time: 12/16/23  5:50 AM  Result Value Ref Range   Phosphorus 5.2 (H) 2.5 - 4.6 mg/dL    Comment: Performed at [facility], [address], [place], [place] [address]  Ferritin     Status: None   Collection Time: 12/16/23  5:50 AM  Result Value Ref Range   Ferritin 129 24 - 336 ng/mL    Comment: Performed at [facility], [address], [place], [place] [address]  Iron and TIBC     Status: Abnormal   Collection Time: 12/16/23  5:50 AM  Result Value Ref Range   Iron 23 (L) 45 - 182 ug/dL   TIBC 284 (L) 132 - 440 ug/dL   Saturation Ratios 12 (L) 17.9 - 39.5 %   UIBC 172 ug/dL    Comment: Performed at [facility],  [address], [place], [place] [address]  Phosphorus     Status: Abnormal   Collection Time: 12/16/23  5:50 AM  Result Value Ref Range   Phosphorus 5.1 (H) 2.5 - 4.6 mg/dL    Comment: Performed at [facility], [address] [place], [place] [address]  Glucose, capillary     Status: Abnormal   Collection Time: 12/16/23  7:43 AM  Result Value Ref Range   Glucose-Capillary 102 (H) 70 - 99 mg/dL    Comment: Glucose reference range applies only to samples taken after fasting for at least 8 hours.  Type and screen [facility]     Status: None (Preliminary result)   Collection Time: 12/16/23  8:11 AM  Result Value Ref Range   ABO/RH(D) A POS    Antibody Screen NEG    Sample Expiration 12/19/2023,2359    Unit Number [id number]    Blood Component Type RED CELLS,LR    Unit division 00    Status of Unit ISSUED    Transfusion Status OK TO TRANSFUSE    Crossmatch Result Compatible    Unit Number [id number]    Blood Component Type RED CELLS,LR    Unit division 00    Status of Unit ISSUED    Transfusion Status OK TO TRANSFUSE    Crossmatch Result      Compatible Performed at [facility], [address] [place], [place] [address]   Vitamin B12     Status: None   Collection Time: 12/16/23  8:31 AM  Result Value Ref Range   Vitamin B-12 298 180 - 914 pg/mL    Comment: (NOTE) This assay is not validated for testing neonatal or myeloproliferative syndrome specimens for Vitamin B12 levels. Performed at [facility], [address], [place], [place] [address]   CK     Status: None   Collection Time: 12/16/23  8:31 AM  Result Value Ref Range   Total CK 199 49 - 397 U/L    Comment: Performed at [facility], [address], [place], [place] [address]  TSH     Status: Abnormal   Collection Time: 12/16/23  8:31 AM  Result  Value Ref Range   TSH 12.506 (H) 0.350 - 4.500 uIU/mL    Comment: Performed by a 3rd Generation assay with a functional sensitivity of <=0.01 uIU/mL. Performed at  [facility], [address], [place], [place] [address]   T4, free     Status: None   Collection Time: 12/16/23  8:31 AM  Result Value Ref Range   Free T4 0.67 0.61 - 1.12 ng/dL    Comment: (NOTE) Biotin ingestion may interfere with free T4 tests. If the results are inconsistent with the TSH level, previous test results, or the clinical presentation, then consider biotin interference. If needed, order repeat testing after stopping biotin. Performed at [facility], [address] [place], [place] [address]   Folate     Status: None   Collection Time: 12/16/23  8:31 AM  Result Value Ref Range   Folate 6.5 >5.9 ng/mL    Comment: Performed at [facility], [address], [place], [place] [address]  Procalcitonin     Status: None   Collection Time: 12/16/23  8:38 AM  Result Value Ref Range   Procalcitonin 0.14 ng/mL    Comment:        Interpretation: PCT (Procalcitonin) <= 0.5 ng/mL: Systemic infection (sepsis) is not likely. Local bacterial infection is possible. (NOTE)       Sepsis PCT Algorithm           Lower Respiratory Tract                                      Infection PCT Algorithm    ----------------------------     ----------------------------         PCT < 0.25 ng/mL                PCT < 0.10 ng/mL          Strongly encourage             Strongly discourage   discontinuation of antibiotics    initiation of antibiotics    ----------------------------     -----------------------------       PCT 0.25 - 0.50 ng/mL            PCT 0.10 - 0.25 ng/mL               OR       >80% decrease in PCT            Discourage initiation of                                            antibiotics      Encourage discontinuation           of antibiotics    ----------------------------     -----------------------------         PCT >= 0.50 ng/mL              PCT 0.26 - 0.50 ng/mL               AND        <80% decrease in PCT             Encourage initiation of  antibiotics       Encourage continuation           of antibiotics    ----------------------------     -----------------------------        PCT >= 0.50 ng/mL                  PCT > 0.50 ng/mL               AND         increase in PCT                  Strongly encourage                                      initiation of antibiotics    Strongly encourage escalation           of antibiotics                                     -----------------------------                                           PCT <= 0.25 ng/mL                                                 OR                                        > 80% decrease in PCT                                      Discontinue / Do not initiate                                             antibiotics  Performed at [facility], [address], [place], [place] [address]   Glucose, capillary     Status: Abnormal   Collection Time: 12/16/23 11:49 AM  Result Value Ref Range   Glucose-Capillary 132 (H) 70 - 99 mg/dL    Comment: Glucose reference range applies only to samples taken after fasting for at least 8 hours.  Rapid urine drug screen (hospital performed)     Status: None   Collection Time: 12/16/23  3:00 PM  Result Value Ref Range   Opiates NONE DETECTED NONE DETECTED   Cocaine NONE DETECTED NONE DETECTED   Benzodiazepines NONE DETECTED NONE DETECTED   Amphetamines NONE DETECTED NONE DETECTED   Tetrahydrocannabinol NONE DETECTED NONE DETECTED   Barbiturates NONE DETECTED NONE DETECTED    Comment: (NOTE) DRUG SCREEN FOR MEDICAL PURPOSES ONLY.  IF CONFIRMATION IS NEEDED FOR ANY PURPOSE, NOTIFY LAB WITHIN 5 DAYS.  LOWEST DETECTABLE LIMITS FOR URINE DRUG SCREEN Drug Class  Cutoff (ng/mL) Amphetamine and metabolites    1000 Barbiturate and metabolites    200 Benzodiazepine                 200 Opiates and metabolites        300 Cocaine and metabolites        300 THC                             50 Performed at [facility], [address], [place], [place] [address]   Urinalysis, w/ Reflex to Culture (Infection Suspected) -Urine, Clean Catch     Status: Abnormal   Collection Time: 12/16/23  3:00 PM  Result Value Ref Range   Specimen Source URINE, CLEAN CATCH    Color, Urine YELLOW YELLOW   APPearance CLEAR CLEAR   Specific Gravity, Urine 1.013 1.005 - 1.030   pH 5.0 5.0 - 8.0   Glucose, UA 50 (A) NEGATIVE mg/dL   Hgb urine dipstick NEGATIVE NEGATIVE   Bilirubin Urine NEGATIVE NEGATIVE   Ketones, ur NEGATIVE NEGATIVE mg/dL   Protein, ur >=604 (A) NEGATIVE mg/dL   Nitrite NEGATIVE NEGATIVE   Leukocytes,Ua NEGATIVE NEGATIVE   RBC / HPF 6-10 0 - 5 RBC/hpf   WBC, UA 0-5 0 - 5 WBC/hpf    Comment:        Reflex urine culture not performed if WBC <=10, OR if Squamous epithelial cells >5. If Squamous epithelial cells >5 suggest recollection.    Bacteria, UA NONE SEEN NONE SEEN   Squamous Epithelial / HPF 0-5 0 - 5 /HPF   Sperm, UA PRESENT     Comment: Performed at [facility], [address], [place], [place] [address]  Glucose, capillary     Status: Abnormal   Collection Time: 12/16/23  4:08 PM  Result Value Ref Range   Glucose-Capillary 142 (H) 70 - 99 mg/dL    Comment: Glucose reference range applies only to samples taken after fasting for at least 8 hours.  Glucose, capillary     Status: Abnormal   Collection Time: 12/16/23  8:33 PM  Result Value Ref Range   Glucose-Capillary 137 (H) 70 - 99 mg/dL    Comment: Glucose reference range applies only to samples taken after fasting for at least 8 hours.   MR BRAIN WO CONTRAST Result Date: 12/16/2023 CLINICAL DATA:  Seizure, new-onset, no history of trauma EXAM: MRI HEAD WITHOUT CONTRAST TECHNIQUE: Multiplanar, multiecho pulse sequences of the brain and surrounding structures were obtained without intravenous contrast. COMPARISON:  CT head 12/15/2023. FINDINGS: Brain: No acute infarction, hemorrhage, hydrocephalus, extra-axial  collection or mass lesion. 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 Associated encephalomalacia and gliosis. Vascular: Major arterial flow voids are maintained skull base. Skull and upper cervical spine: Normal marrow signal. Sinuses/Orbits: Mostly clear sinuses.  No acute orbital findings. Other: No mastoid effusions. IMPRESSION: 1. No evidence of acute intracranial abnormality. 2. 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 Electronically Signed   By: Feliberto Harts M.D.   On: 12/16/2023 16:56   ECHOCARDIOGRAM COMPLETE Result Date: 12/16/2023    ECHOCARDIOGRAM REPORT   Patient Name:   GUERRY COVINGTON Date of Exam: 12/16/2023 Medical Rec #:  [id number]       Height:       70.0 in Accession #:    [id number]      Weight:       180.0 lb Date of Birth:  1966-06-07        BSA:          1.996 m Patient Age:    57 years        BP:           148/67 mmHg Patient Gender: M               HR:           80 bpm. [facility] Procedure: 2D Echo, Cardiac Doppler, Color Doppler and Intracardiac            Opacification Agent Indications:    Syncope  History:        Patient has prior history of Echocardiogram examinations, most                 recent 04/24/2022. Risk Factors:Hypertension, Diabetes and Current                 Smoker.  Sonographer:    Karma Ganja Referring Phys: [id number] OLADAPO ADEFESO IMPRESSIONS  1. Left ventricular ejection fraction, by estimation, is 55 to 60%. The left ventricle has normal function. The left ventricle has no regional wall motion abnormalities. There is severe concentric left ventricular hypertrophy. Left ventricular diastolic  parameters are consistent with Grade I diastolic dysfunction (impaired relaxation).  2. Right ventricular systolic function is normal. The right ventricular size is normal.  3. Left atrial size was mildly dilated.  4. The mitral valve is normal in structure. Trivial mitral valve regurgitation. No evidence of mitral  stenosis.  5. The aortic valve is normal in structure. Aortic valve regurgitation is not visualized. Aortic valve sclerosis/calcification is present, without any evidence of aortic stenosis.  6. The inferior vena cava is normal in size with greater than 50% respiratory variability, suggesting right atrial pressure of 3 mmHg. FINDINGS  Left Ventricle: Left ventricular ejection fraction, by estimation, is 55 to 60%. The left ventricle has normal function. The left ventricle has no regional wall motion abnormalities. The left ventricular internal cavity size was normal in size. There is  severe concentric left ventricular hypertrophy. Left ventricular diastolic parameters are consistent with Grade I diastolic dysfunction (impaired relaxation). Right Ventricle: The right ventricular size is normal. No increase in right ventricular wall thickness. Right ventricular systolic function is normal. Left Atrium: Left atrial size was mildly dilated. Right Atrium: Right atrial size was normal in size. Pericardium: There is no evidence of pericardial effusion. Mitral Valve: The mitral valve is normal in structure. Trivial mitral valve regurgitation. No evidence of mitral valve stenosis. Tricuspid Valve: The tricuspid valve is normal in structure. Tricuspid valve regurgitation is trivial. No evidence of tricuspid stenosis. Aortic Valve: The aortic valve is normal in structure. Aortic valve regurgitation is not visualized. Aortic valve sclerosis/calcification is present, without any evidence of aortic stenosis. Aortic valve mean gradient measures 6.0 mmHg. Aortic valve peak  gradient measures 10.4 mmHg. Aortic valve area, by VTI measures 2.08 cm. Pulmonic Valve: The pulmonic valve was normal in structure. Pulmonic valve regurgitation is not visualized. No evidence of pulmonic stenosis. Aorta: The aortic root is normal in size and structure. Venous: The inferior vena cava is normal in size with greater than 50% respiratory  variability, suggesting right atrial pressure of 3 mmHg. IAS/Shunts: No atrial level shunt detected by color flow Doppler.  LEFT VENTRICLE PLAX 2D LVIDd:         5.40 cm   Diastology LVIDs:  3.30 cm   LV e' medial:    5.11 cm/s LV PW:         1.70 cm   LV E/e' medial:  15.0 LV IVS:        1.70 cm   LV e' lateral:   6.53 cm/s LVOT diam:     2.00 cm   LV E/e' lateral: 11.7 LV SV:         62 LV SV Index:   31 LVOT Area:     3.14 cm                           3D Volume EF:                          3D EF:        53 %                          LV EDV:       156 ml                          LV ESV:       73 ml                          LV SV:        83 ml RIGHT VENTRICLE             IVC RV Basal diam:  3.90 cm     IVC diam: 1.70 cm RV S prime:     16.30 cm/s TAPSE (M-mode): 2.3 cm LEFT ATRIUM             Index        RIGHT ATRIUM           Index LA diam:        4.90 cm 2.46 cm/m   RA Area:     16.40 cm LA Vol (A2C):   78.5 ml 39.33 ml/m  RA Volume:   43.60 ml  21.84 ml/m LA Vol (A4C):   68.2 ml 34.17 ml/m LA Biplane Vol: 75.1 ml 37.63 ml/m  AORTIC VALVE AV Area (Vmax):    2.32 cm AV Area (Vmean):   2.07 cm AV Area (VTI):     2.08 cm AV Vmax:           161.00 cm/s AV Vmean:          112.000 cm/s AV VTI:            0.296 m AV Peak Grad:      10.4 mmHg AV Mean Grad:      6.0 mmHg LVOT Vmax:         119.00 cm/s LVOT Vmean:        73.700 cm/s LVOT VTI:          0.196 m LVOT/AV VTI ratio: 0.66  AORTA Ao Root diam: 3.40 cm MITRAL VALVE MV Area (PHT): 3.46 cm    SHUNTS MV Decel Time: 219 msec    Systemic VTI:  0.20 m MV E velocity: 76.70 cm/s  Systemic Diam: 2.00 cm MV A velocity: 88.30 cm/s MV E/A ratio:  0.87 Kardie Tobb DO Electronically signed by Thomasene Ripple DO Signature Date/Time: 12/16/2023/1:05:42 PM    Final    CT Head Wo Contrast  Result Date: 12/15/2023 CLINICAL DATA:  Syncope/presyncope, cerebrovascular cause suspected EXAM: CT HEAD WITHOUT CONTRAST TECHNIQUE: Contiguous axial images were obtained from  the base of the skull through the vertex without intravenous contrast. RADIATION DOSE REDUCTION: This exam was performed according to the departmental dose-optimization program which includes automated exposure control, adjustment of the mA and/or kV according to patient size and/or use of iterative reconstruction technique. COMPARISON:  None Available. 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 No evidence of acute large vascular territory infarct, acute hemorrhage, mass lesion or midline shift. No hydrocephalus. Vascular: No hyperdense vessel identified. Calcific atherosclerosis. Skull: No acute fracture. Sinuses/Orbits: Clear sinuses.  No acute orbital findings. Other: No mastoid effusions. IMPRESSION: 1. No evidence of acute intracranial abnormality. 2. 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 Electronically Signed   By: Feliberto Harts M.D.   On: 12/15/2023 16:45   DG Chest Port 1 View Result Date: 12/15/2023 CLINICAL DATA:  Shortness of breath EXAM: PORTABLE CHEST 1 VIEW COMPARISON:  04/24/2022 FINDINGS: Low lung volume. Borderline cardiac enlargement. No acute airspace disease or effusion. No pneumothorax IMPRESSION: Low lung volume. No active disease. Electronically Signed   By: Jasmine Pang M.D.   On: 12/15/2023 16:18    PMH:   Past Medical History:  Diagnosis Date   Blind left eye    Chronic renal failure, stage 3a (HCC)    HTN (hypertension)    Type II diabetes mellitus with renal manifestations (HCC)     PSH:  History reviewed. No pertinent surgical history.  Allergies: No Known Allergies  Medications:   Prior to Admission medications   Medication Sig Start Date End Date Taking? Authorizing Provider  apixaban (ELIQUIS) 5 MG TABS tablet Take 1 tablet (5 mg total) by mouth 2 (two) times daily. Start AFTER completing starter pack 05/25/22  Yes Wouk, Wilfred Curtis, MD  calcitRIOL (ROCALTROL) 0.25  MCG capsule Take 0.25 mcg by mouth daily.   Yes [provider]  carvedilol (COREG) 25 MG tablet Take 25 mg by mouth 2 (two) times daily with a meal.   Yes [provider]  furosemide (LASIX) 40 MG tablet Take 40 mg by mouth 2 (two) times daily.   Yes [provider]  glipiZIDE (GLUCOTROL) 10 MG tablet Take 2.5 mg by mouth daily before breakfast. If blood sugar less than or equal to 70, do not take medication.   Yes [provider]  sodium bicarbonate 650 MG tablet Take 650 mg by mouth 3 (three) times daily.   Yes [provider]  amLODipine (NORVASC) 5 MG tablet Take 10 mg by mouth daily. Patient not taking: Reported on 12/15/2023    [provider]  APIXABAN Everlene Balls) VTE STARTER PACK (10MG  AND 5MG ) Take as directed on package: start with two-5mg  tablets twice daily for 7 days. On day 8, switch to one-5mg  tablet twice daily. 04/25/22   Wouk, Wilfred Curtis, MD  losartan (COZAAR) 25 MG tablet Take 25 mg by mouth daily. Patient not taking: Reported on 12/15/2023    [provider]  metFORMIN (GLUCOPHAGE) 1000 MG tablet Take 1,000 mg by mouth 2 (two) times daily with a meal. Patient not taking: Reported on 12/15/2023    [provider]    Discontinued Meds:   Medications Discontinued During This Encounter  Medication Reason   amLODipine (NORVASC) tablet 10 mg    losartan (COZAAR) tablet 25 mg  heparin injection 5,000 Units     Social History:  reports that he has been smoking cigarettes. He has never used smokeless tobacco. He reports that he does not currently use alcohol. He reports that he does not use drugs.  Family History:   Family History  Problem Relation Age of Onset   Deep vein thrombosis Daughter    Pulmonary embolism Daughter     Blood pressure (!) 171/73, pulse 72, temperature 98.4 F (36.9 C), temperature source Oral, resp. rate 20, height 5\' 10"  (1.778 m), weight 81.6 kg, SpO2 99%. Physical  Exam: CONSTITUTIONAL: Alert, NAD HEENT: oropharynx clear  NECK: Supple, no LAD noted CARDIOVASCULAR: RRR, no murmurs PULM: CTA b/l, no rales GASTROINTESTINAL: SNDNT+BS MSK/EXTREMITIES: 1+ lower extremity edema bilaterally, dorsalis pedis pulses 2+ bilaterally  SKIN: No rashes or lesions NEUROLOGIC: No focal motor or sensory deficits      Wilhelmina Hark, Len Blalock, MD 12/17/2023, 5:10 AM

## 2023-12-18 LAB — PARATHYROID HORMONE, INTACT (NO CA): PTH: 178 pg/mL — ABNORMAL HIGH (ref 15–65)

## 2024-04-18 IMAGING — CR DG CHEST 2V
2 series · 2 of 2 positions shown · non-contrast
Comparison: None Available.

CLINICAL DATA: Sided chest pain.  Evaluate for infiltrate.

EXAM:
CHEST - 2 VIEW

[chest pa]
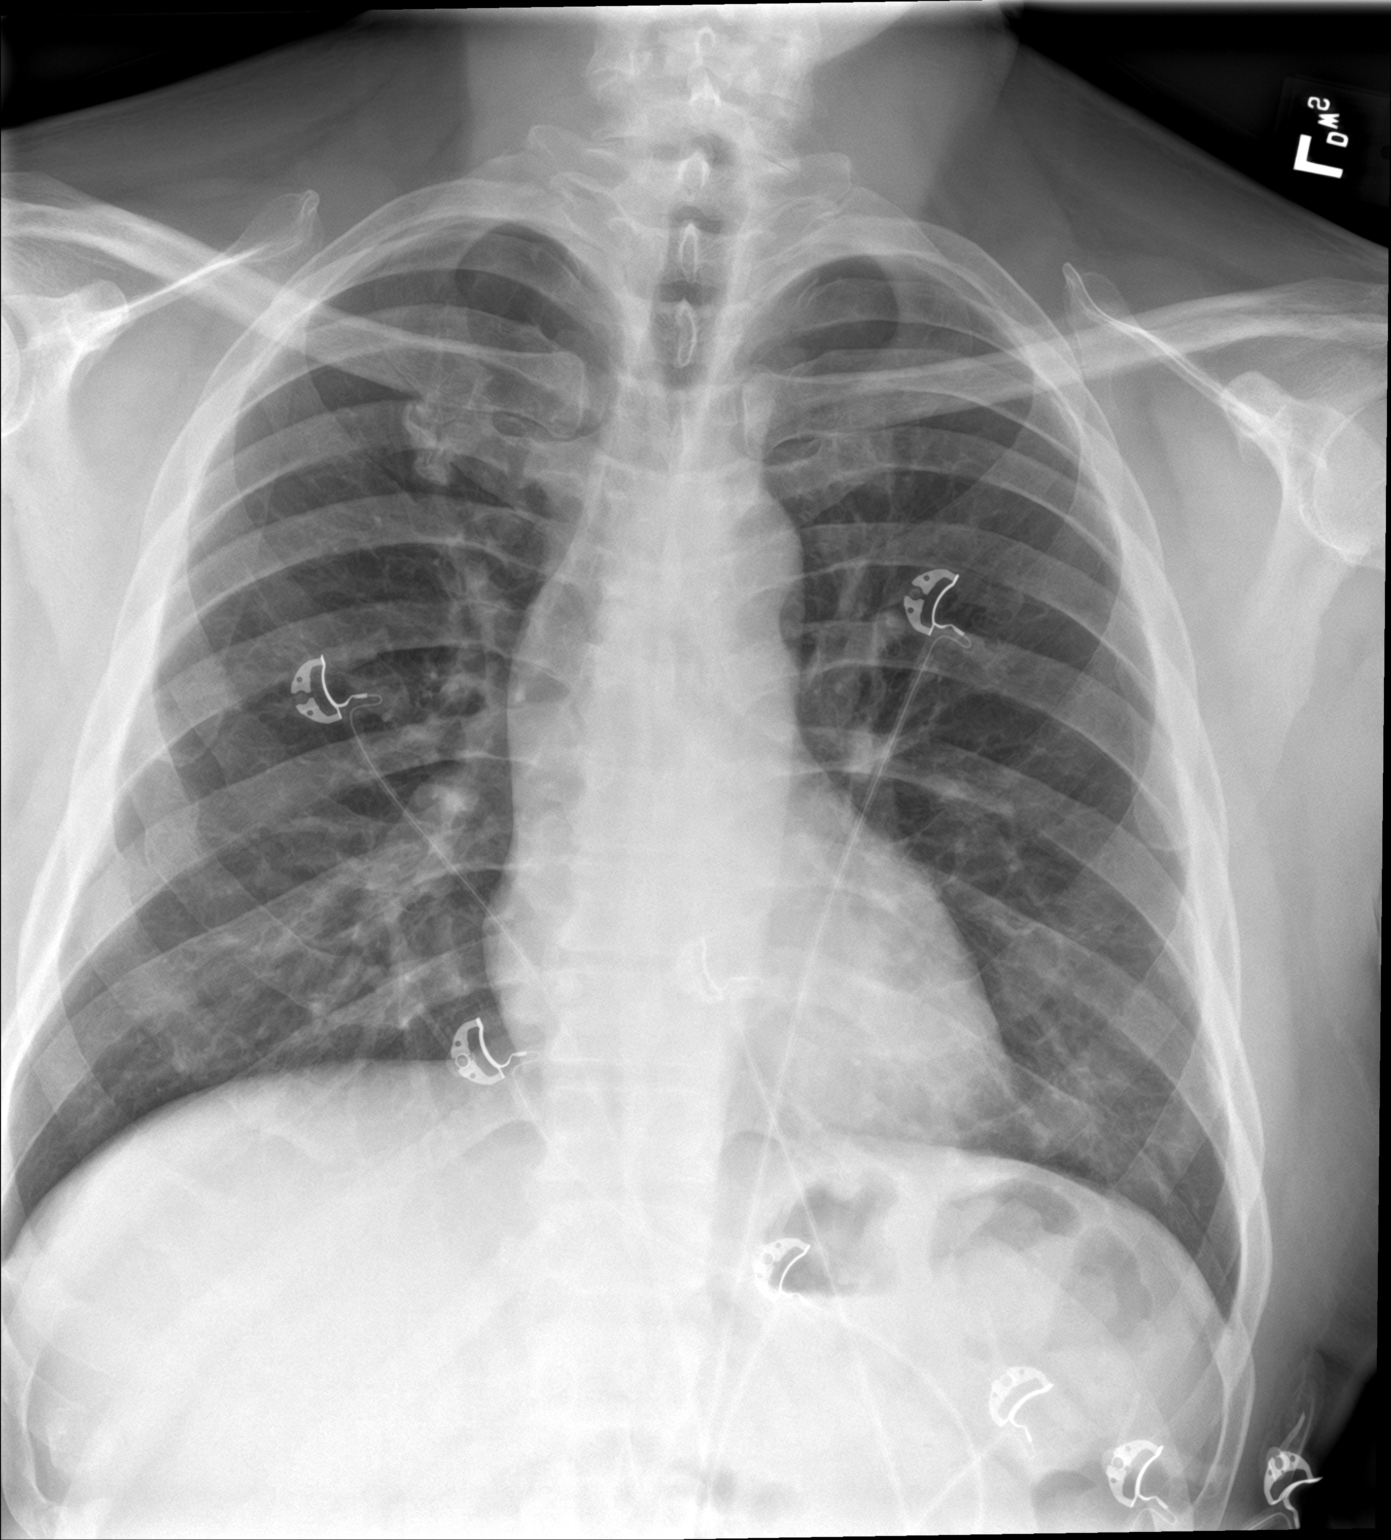

[chest lat]
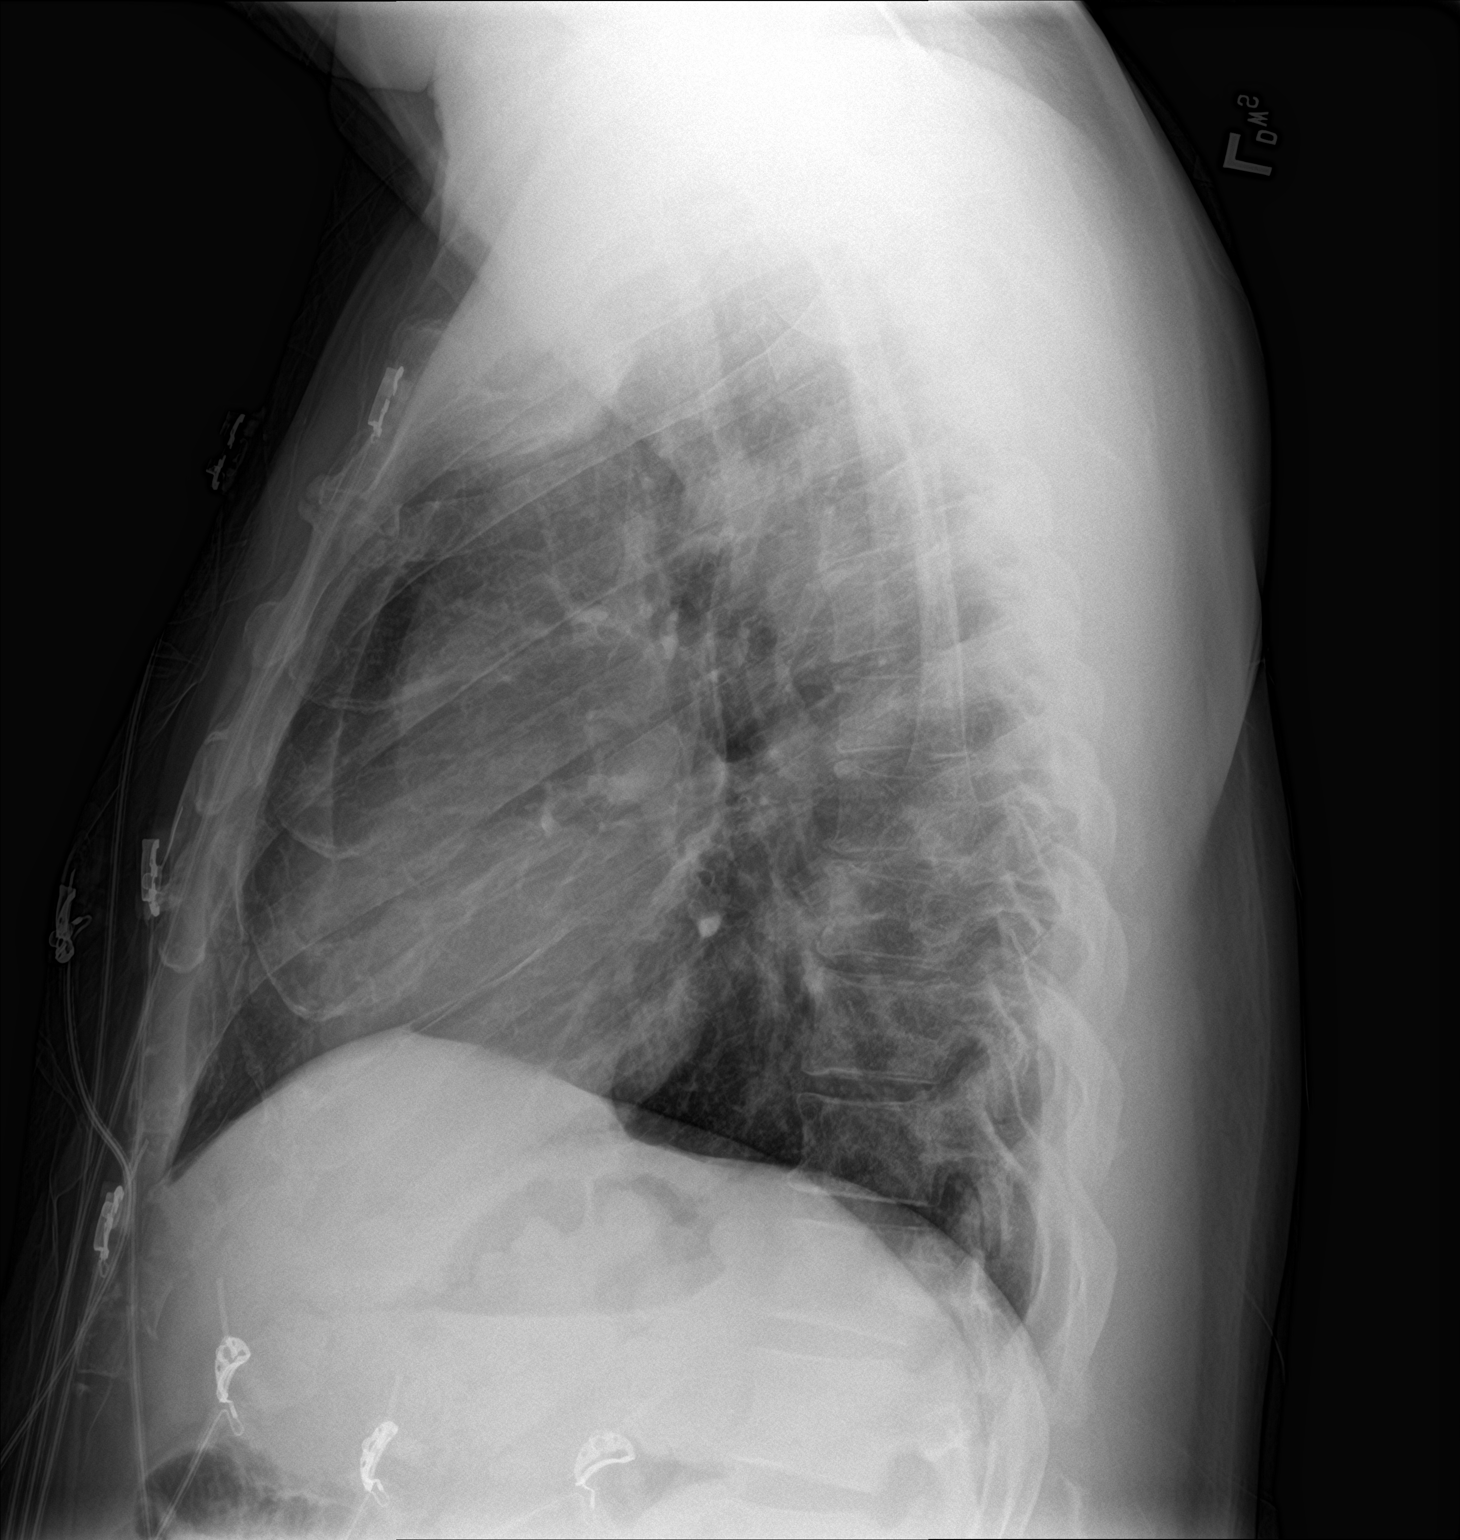

[2 of 2 positions shown; findings below may reference images not displayed]

FINDINGS: The heart size and mediastinal contours are within normal limits.
Both lungs are clear. The visualized skeletal structures are
unremarkable.
IMPRESSION: No active cardiopulmonary disease.

## 2024-04-18 IMAGING — US US EXTREM LOW VENOUS
1 series · 14 of 24 positions shown · non-contrast
Comparison: None Available.

CLINICAL DATA: 55-year-old male with acute pulmonary emboli.
Evaluate LOWER extremities for DVT.

EXAM:
BILATERAL LOWER EXTREMITY VENOUS DOPPLER ULTRASOUND
TECHNIQUE: Gray-scale sonography with compression, as well as color and duplex
ultrasound, were performed to evaluate the deep venous system(s)
from the level of the common femoral vein through the popliteal and
proximal calf veins.

[Series 1: us venous img lower bilat (dvt) · portal-venous · 14 of 68 slices shown]
[im 1/68]
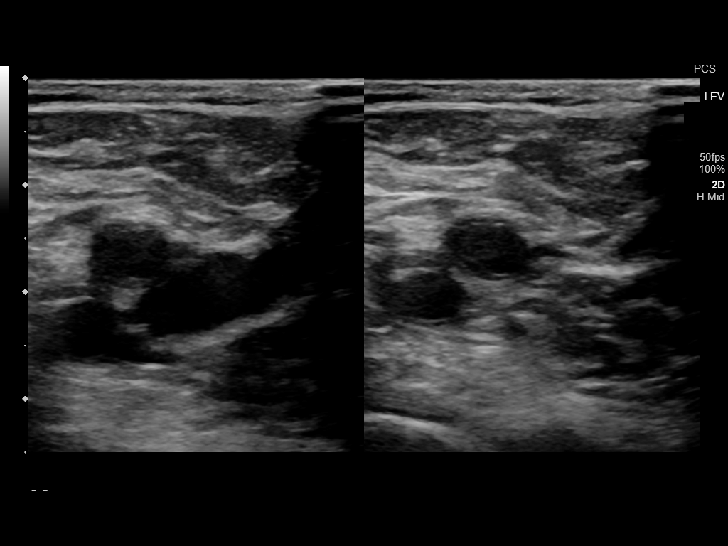
[im 6/68]
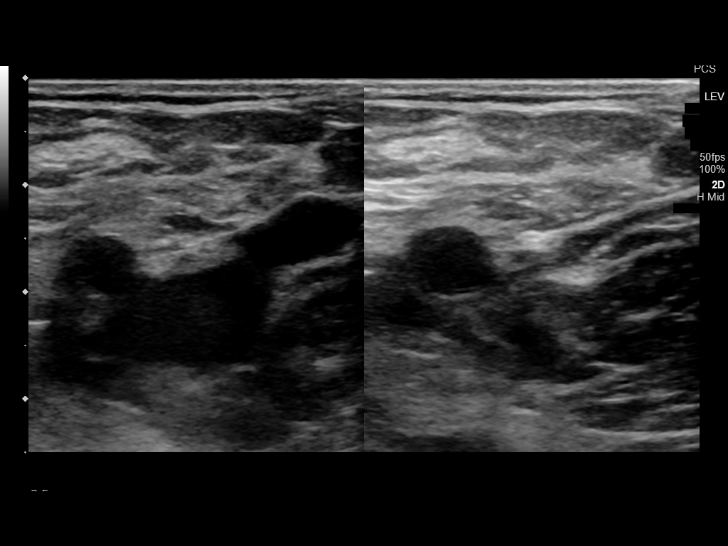
[im 12/68]
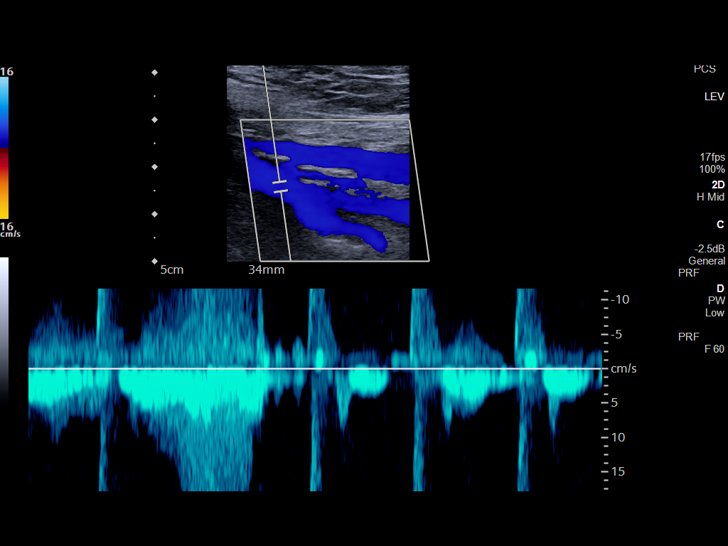
[im 18/68]
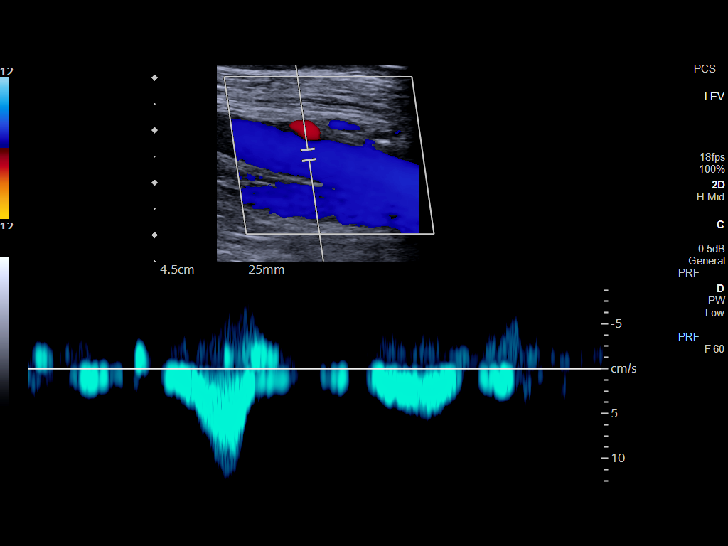
[im 21/68]
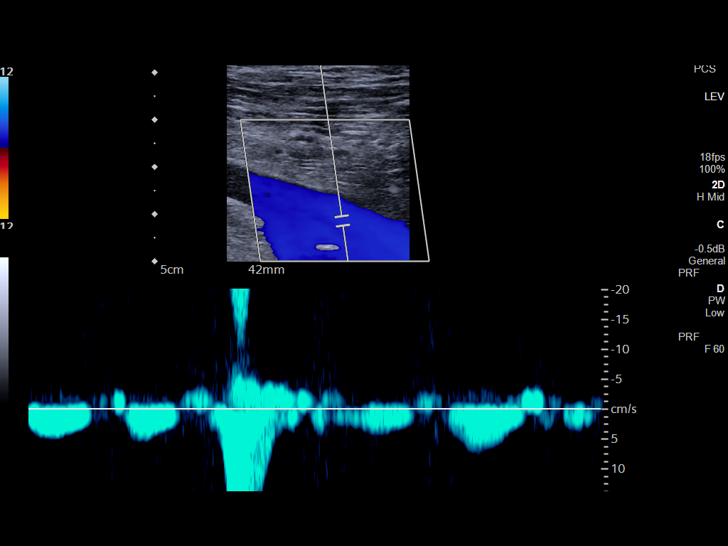
[im 27/68]
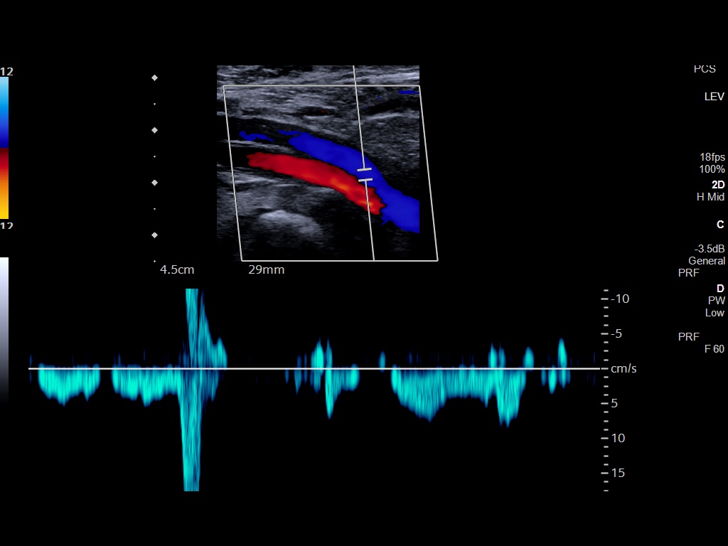
[im 33/68]
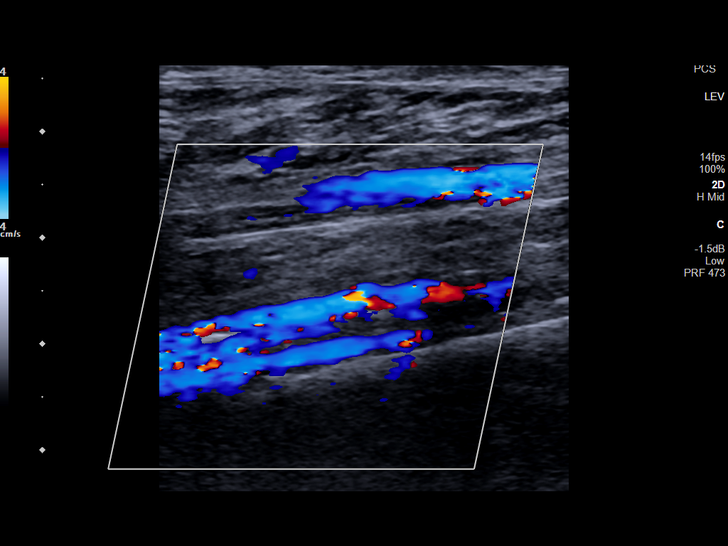
[im 35/68]
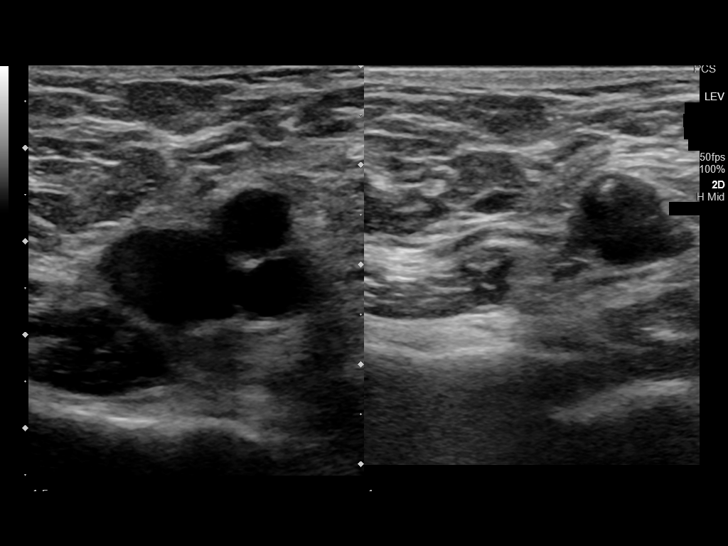
[im 41/68]
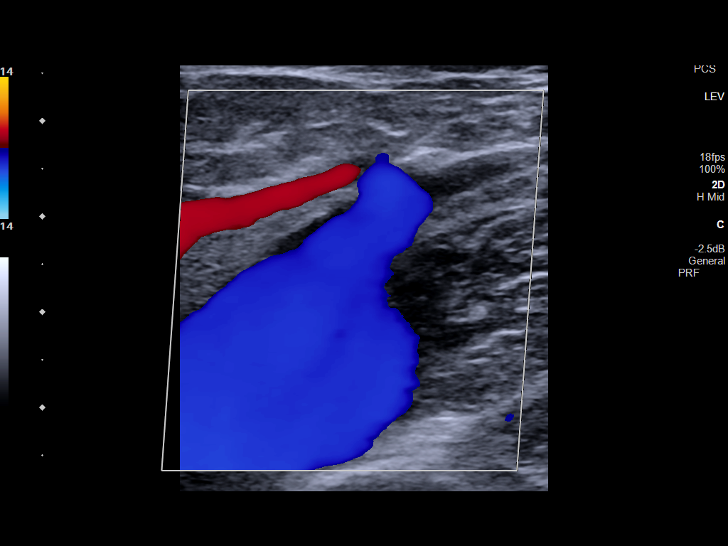
[im 47/68]
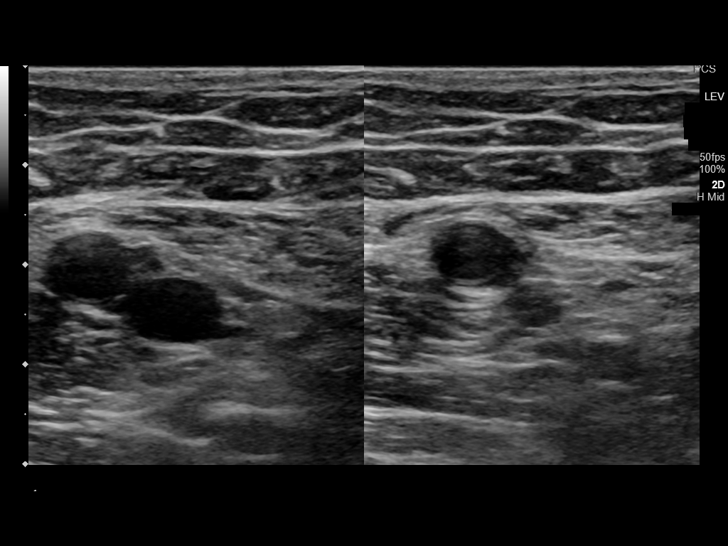
[im 53/68]
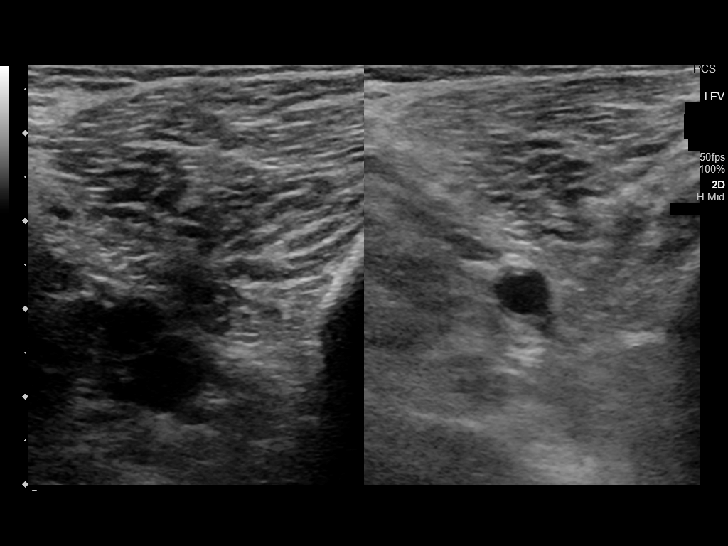
[im 56/68]
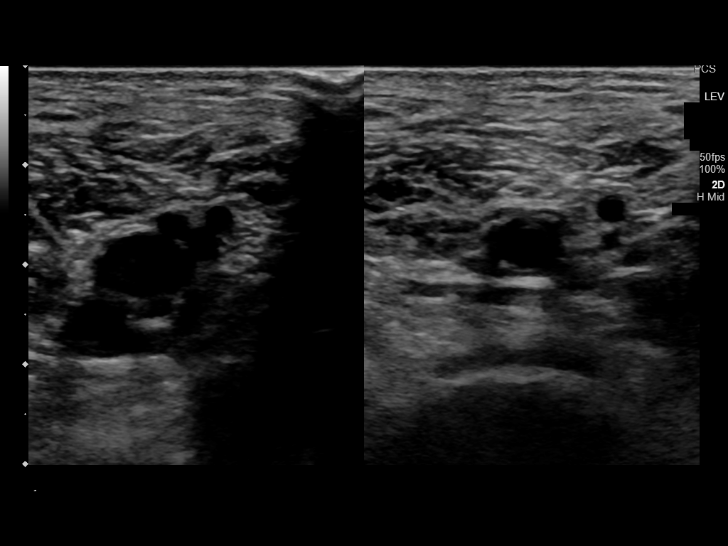
[im 62/68]
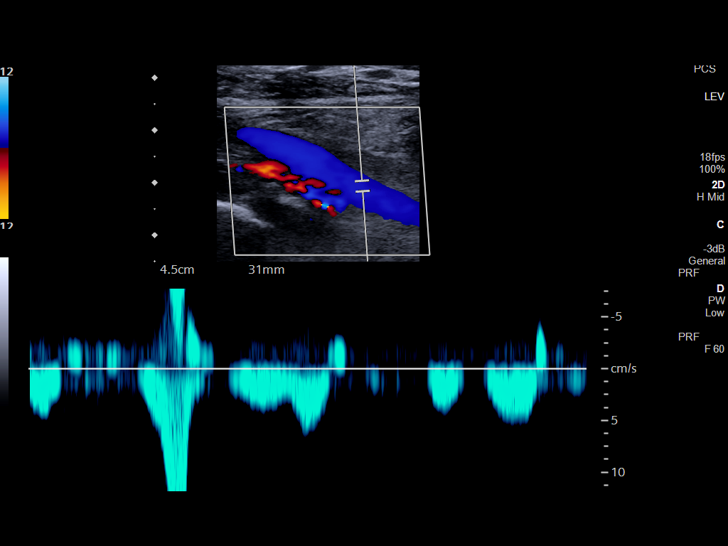
[im 68/68]
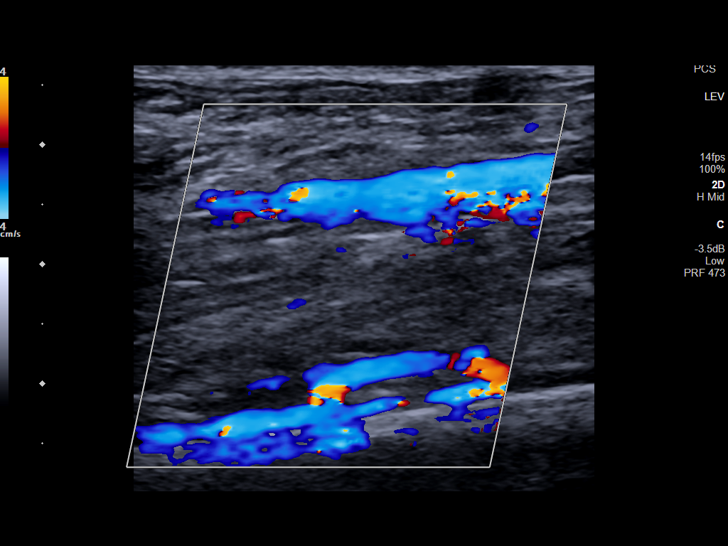

[14 of 24 positions shown; findings below may reference images not displayed]

FINDINGS: VENOUS

Normal compressibility of the common femoral, superficial femoral,
and popliteal veins, as well as the visualized calf veins.
Visualized portions of profunda femoral vein and great saphenous
vein unremarkable. No filling defects to suggest DVT on grayscale or
color Doppler imaging. Doppler waveforms show normal direction of
venous flow, normal respiratory plasticity and response to
augmentation.

OTHER

None.

Limitations: none
IMPRESSION: Negative.  No evidence of DVT within bilateral LOWER extremities.

## 2024-04-18 IMAGING — CT CT ANGIO CHEST
2 of 7 series · 18 of 46 positions shown · IV contrast (APPLIED)
Comparison: Same day chest x-ray

CLINICAL DATA: Pulmonary embolism (PE) suspected, positive D-dimer

EXAM:
CT ANGIOGRAPHY CHEST WITH CONTRAST
TECHNIQUE: Multidetector CT imaging of the chest was performed using the
standard protocol during bolus administration of intravenous
contrast. Multiplanar CT image reconstructions and MIPs were
obtained to evaluate the vascular anatomy.

[Series 6: thins · axial · 0.72mm/px · z∈[-711,-442]mm · 15 of 435 slices shown]
[im 25/435  lung]
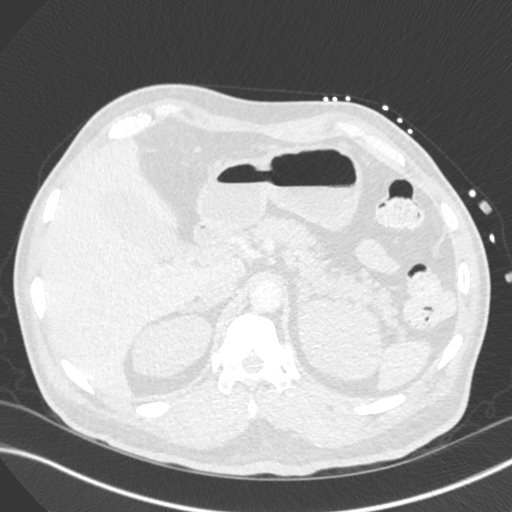
[im 49/435  soft-tissue]
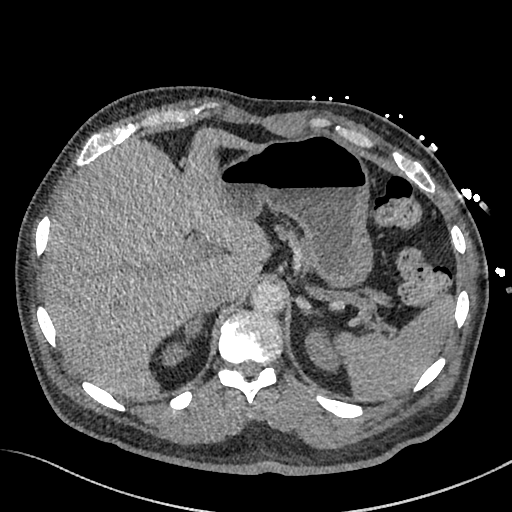
[im 73/435  lung]
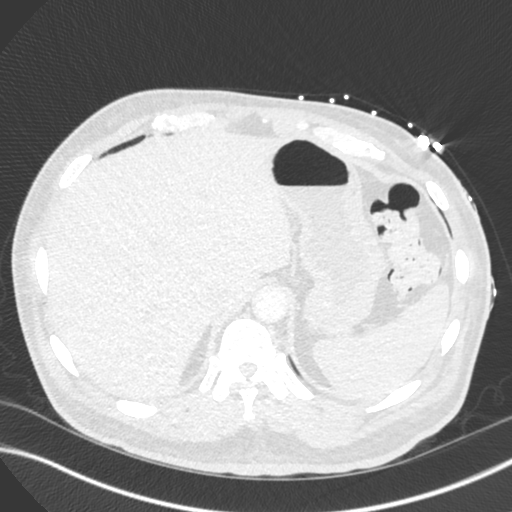
[im 97/435  soft-tissue]
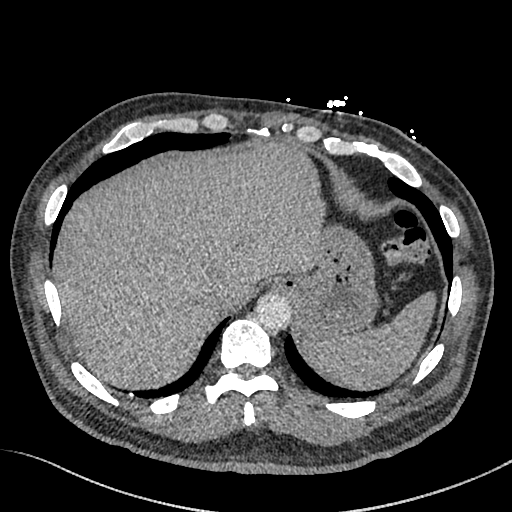
[im 145/435  lung]
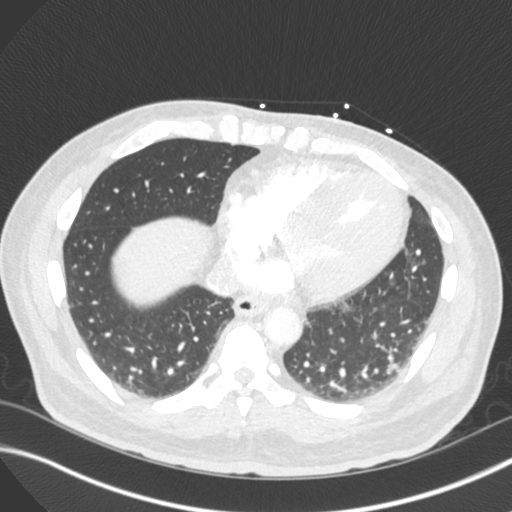
[im 169/435  soft-tissue]
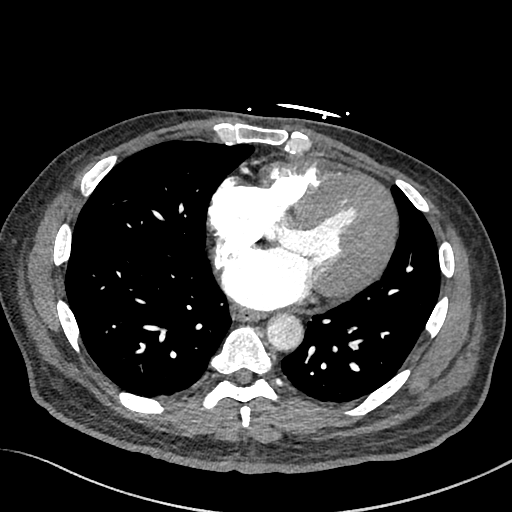
[im 193/435  lung]
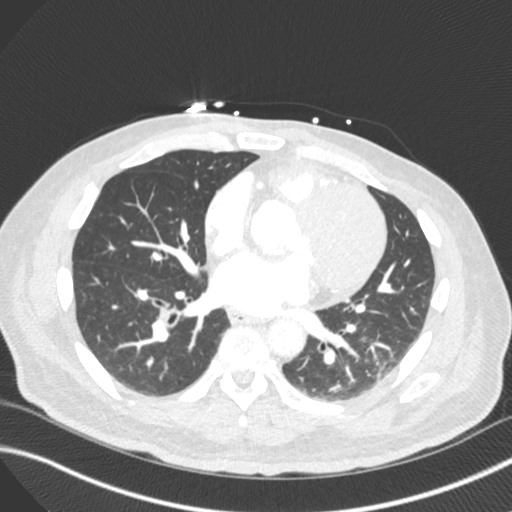
[im 218/435  soft-tissue]
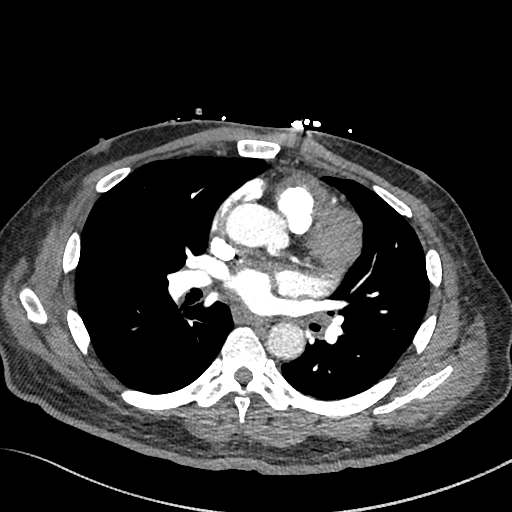
[im 242/435  lung]
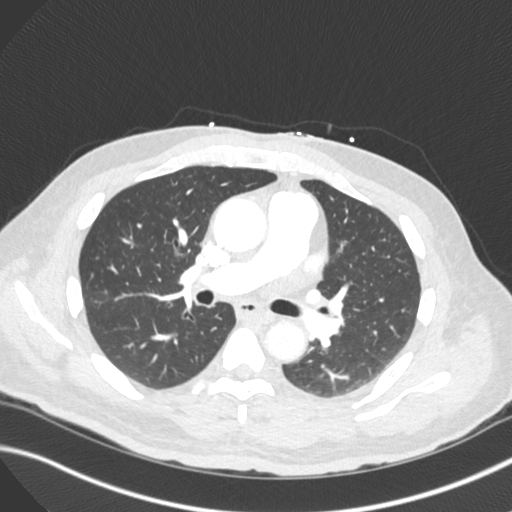
[im 266/435  soft-tissue]
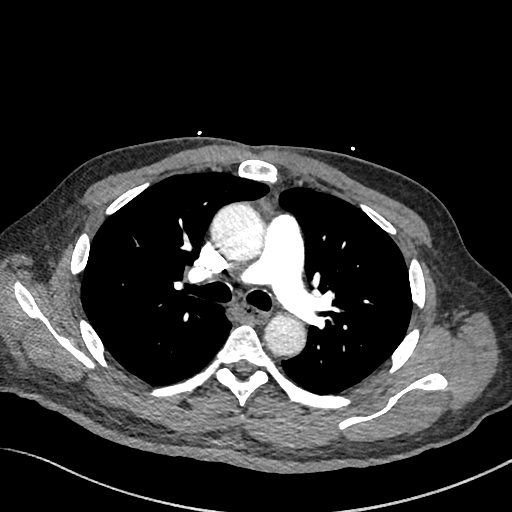
[im 290/435  lung]
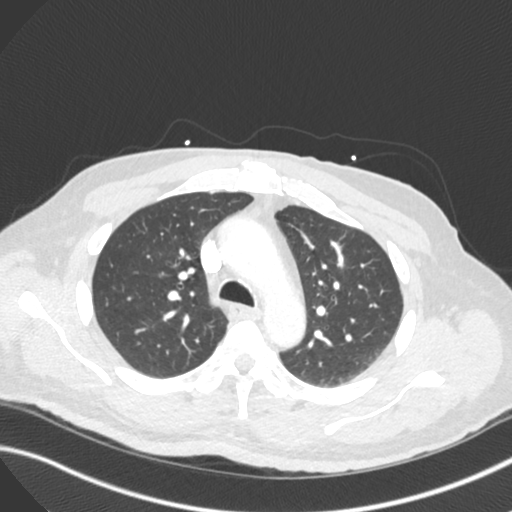
[im 338/435  soft-tissue]
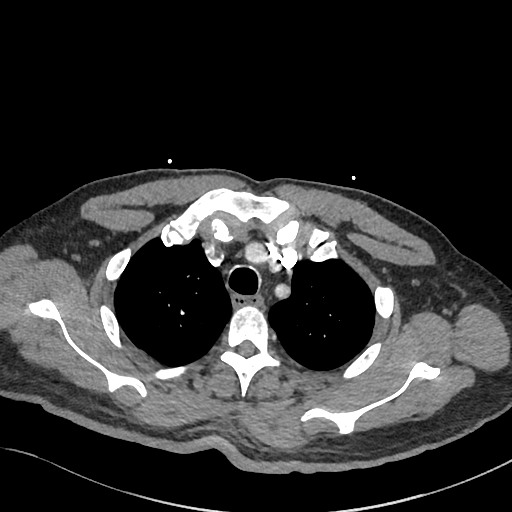
[im 362/435  lung]
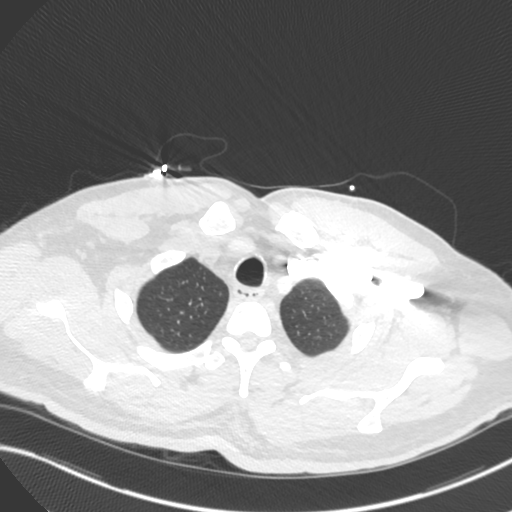
[im 386/435  soft-tissue]
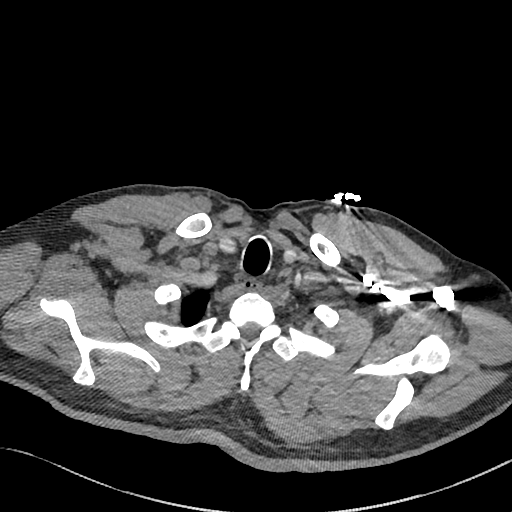
[im 410/435  lung]
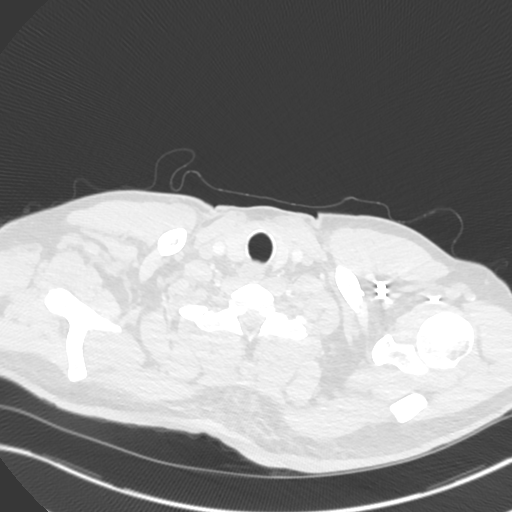

[Series 7: cor · coronal · 0.63mm/px · 3 of 139 slices shown]
[im 35/139  soft-tissue]
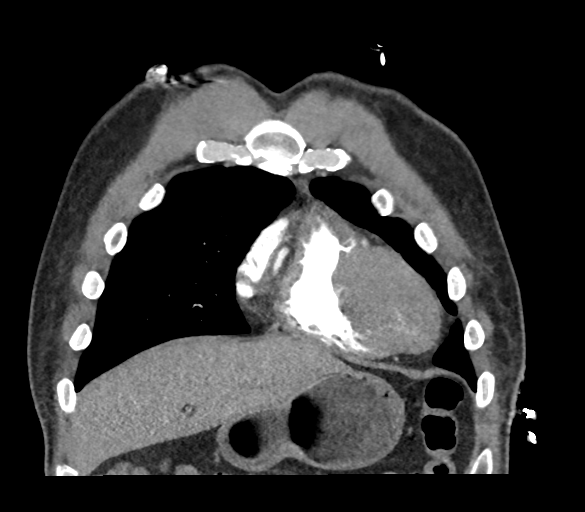
[im 70/139  soft-tissue]
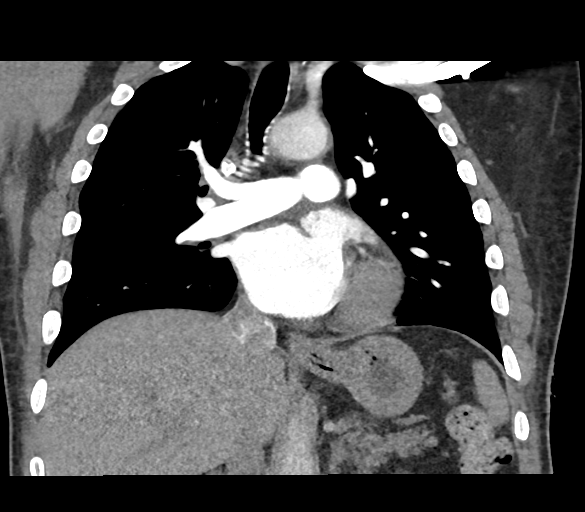
[im 104/139  soft-tissue]
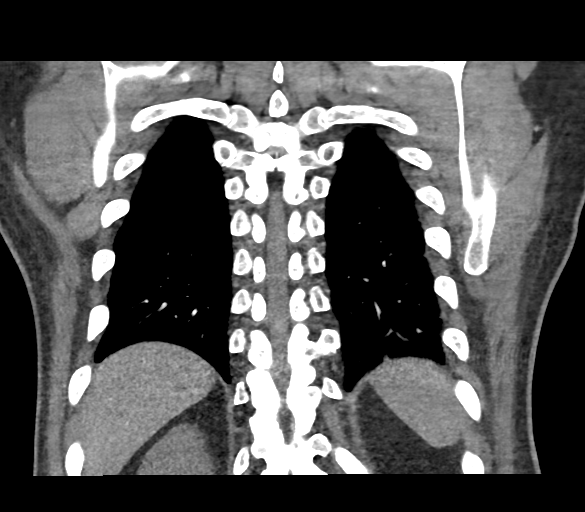

[18 of 46 positions shown; findings below may reference images not displayed]

RADIATION DOSE REDUCTION: This exam was performed according to the
departmental dose-optimization program which includes automated
exposure control, adjustment of the mA and/or kV according to
patient size and/or use of iterative reconstruction technique.

CONTRAST:  75mL OMNIPAQUE IOHEXOL 350 MG/ML SOLN
FINDINGS: Cardiovascular: Adequate opacification of the pulmonary arteries.
Filling defect seen within a anterior segmental branch pulmonary
artery of the left upper lobe (series 6, image 156) extending into
distal subsegmental branch arteries. Additional segmental branch
left lower lobe PE (series 6, image 208). No right-sided pulmonary
arterial filling defect. No saddle embolism. RV to LV ratio of 0.8.
Nonenlarged heart with prominent left ventricular hypertrophy.
Thoracic aorta is nonaneurysmal. Scattered atherosclerotic
calcifications of the aorta and coronary arteries.

Mediastinum/Nodes: No enlarged mediastinal, hilar, or axillary lymph
nodes. Thyroid gland, trachea, and esophagus demonstrate no
significant findings.

Lungs/Pleura: Mild bibasilar atelectasis, slightly more pronounced
on the left. Lungs are otherwise clear. No pleural effusion or
pneumothorax.

Upper Abdomen: No acute abnormality.

Musculoskeletal: No chest wall abnormality. No acute or significant
osseous findings. Multiple old healed right-sided rib fractures.

Review of the MIP images confirms the above findings.
IMPRESSION: 1. Acute pulmonary embolism involving segmental and subsegmental
branch pulmonary arteries of the left upper lobe and left lower
lobe. Additional segmental branch PE within the left lower lobe. No
saddle embolism. RV to LV ratio of 0.8.
2. Mild bibasilar atelectasis, slightly more pronounced on the left.
Lungs are otherwise clear.
3. Left ventricular hypertrophy. Consider further evaluation with
echocardiography.
4. Aortic and coronary artery atherosclerosis ([id number]).

These results were called by telephone at the time of interpretation
on 04/24/2022 at [DATE] to provider COOKOO WATCH ZEGARAC , who verbally
acknowledged these results.
